# Patient Record
Sex: Male | Born: 1950 | Race: White | Hispanic: No | Marital: Married | Smoking: Never smoker
Health system: Southern US, Community
[De-identification: ages and names within clinical notes are randomized; demographics above are authoritative.]

## PROBLEM LIST (undated history)

## (undated) DIAGNOSIS — H269 Unspecified cataract: Secondary | ICD-10-CM

## (undated) DIAGNOSIS — K5730 Diverticulosis of large intestine without perforation or abscess without bleeding: Secondary | ICD-10-CM

## (undated) DIAGNOSIS — E079 Disorder of thyroid, unspecified: Secondary | ICD-10-CM

## (undated) DIAGNOSIS — T7840XA Allergy, unspecified, initial encounter: Secondary | ICD-10-CM

## (undated) DIAGNOSIS — M549 Dorsalgia, unspecified: Secondary | ICD-10-CM

## (undated) DIAGNOSIS — N189 Chronic kidney disease, unspecified: Secondary | ICD-10-CM

## (undated) DIAGNOSIS — E785 Hyperlipidemia, unspecified: Secondary | ICD-10-CM

## (undated) HISTORY — PX: COLONOSCOPY: SHX174

## (undated) HISTORY — DX: Chronic kidney disease, unspecified: N18.9

## (undated) HISTORY — DX: Dorsalgia, unspecified: M54.9

## (undated) HISTORY — DX: Hyperlipidemia, unspecified: E78.5

## (undated) HISTORY — DX: Disorder of thyroid, unspecified: E07.9

## (undated) HISTORY — DX: Unspecified cataract: H26.9

## (undated) HISTORY — DX: Diverticulosis of large intestine without perforation or abscess without bleeding: K57.30

## (undated) HISTORY — DX: Allergy, unspecified, initial encounter: T78.40XA

## (undated) HISTORY — PX: POLYPECTOMY: SHX149

---

## 1986-05-01 HISTORY — PX: CHOLECYSTECTOMY: SHX55

## 1986-05-01 HISTORY — PX: INCISIONAL HERNIA REPAIR: SHX193

## 1999-08-23 ENCOUNTER — Encounter (INDEPENDENT_AMBULATORY_CARE_PROVIDER_SITE_OTHER): Payer: Self-pay

## 1999-08-23 ENCOUNTER — Other Ambulatory Visit: Admission: RE | Admit: 1999-08-23 | Discharge: 1999-08-23 | Payer: Self-pay | Admitting: Gastroenterology

## 2003-06-22 ENCOUNTER — Ambulatory Visit (HOSPITAL_BASED_OUTPATIENT_CLINIC_OR_DEPARTMENT_OTHER): Admission: RE | Admit: 2003-06-22 | Discharge: 2003-06-22 | Payer: Self-pay | Admitting: Internal Medicine

## 2003-06-26 ENCOUNTER — Encounter: Payer: Self-pay | Admitting: Internal Medicine

## 2005-02-01 ENCOUNTER — Ambulatory Visit: Payer: Self-pay | Admitting: Internal Medicine

## 2005-05-18 ENCOUNTER — Ambulatory Visit: Payer: Self-pay | Admitting: Internal Medicine

## 2005-05-26 ENCOUNTER — Ambulatory Visit: Payer: Self-pay | Admitting: Internal Medicine

## 2006-03-07 ENCOUNTER — Ambulatory Visit: Payer: Self-pay | Admitting: Internal Medicine

## 2006-07-23 ENCOUNTER — Ambulatory Visit: Payer: Self-pay | Admitting: Internal Medicine

## 2007-07-22 ENCOUNTER — Ambulatory Visit: Payer: Self-pay | Admitting: Internal Medicine

## 2007-07-22 LAB — CONVERTED CEMR LAB
ALT: 36 units/L (ref 0–53)
Basophils Relative: 0.5 % (ref 0.0–1.0)
Bilirubin, Direct: 0.2 mg/dL (ref 0.0–0.3)
CO2: 30 meq/L (ref 19–32)
Eosinophils Relative: 4.7 % (ref 0.0–5.0)
GFR calc Af Amer: 111 mL/min
Glucose, Bld: 102 mg/dL — ABNORMAL HIGH (ref 70–99)
Glucose, Urine, Semiquant: NEGATIVE
Hemoglobin: 15.4 g/dL (ref 13.0–17.0)
Lymphocytes Relative: 21.7 % (ref 12.0–46.0)
Monocytes Absolute: 0.3 10*3/uL (ref 0.2–0.7)
Neutro Abs: 3.6 10*3/uL (ref 1.4–7.7)
Neutrophils Relative %: 66.5 % (ref 43.0–77.0)
Nitrite: NEGATIVE
Potassium: 4.3 meq/L (ref 3.5–5.1)
TSH: 2.12 microintl units/mL (ref 0.35–5.50)
Total Protein: 6.3 g/dL (ref 6.0–8.3)
VLDL: 22 mg/dL (ref 0–40)
WBC Urine, dipstick: NEGATIVE
WBC: 5.2 10*3/uL (ref 4.5–10.5)
pH: 5.5

## 2007-07-29 ENCOUNTER — Ambulatory Visit: Payer: Self-pay | Admitting: Internal Medicine

## 2007-07-29 DIAGNOSIS — E785 Hyperlipidemia, unspecified: Secondary | ICD-10-CM | POA: Insufficient documentation

## 2007-07-29 DIAGNOSIS — Z860100 Personal history of colon polyps, unspecified: Secondary | ICD-10-CM | POA: Insufficient documentation

## 2007-07-29 DIAGNOSIS — K5730 Diverticulosis of large intestine without perforation or abscess without bleeding: Secondary | ICD-10-CM

## 2007-07-29 DIAGNOSIS — E039 Hypothyroidism, unspecified: Secondary | ICD-10-CM

## 2007-07-29 HISTORY — DX: Diverticulosis of large intestine without perforation or abscess without bleeding: K57.30

## 2008-07-24 ENCOUNTER — Ambulatory Visit: Payer: Self-pay | Admitting: Internal Medicine

## 2008-07-24 LAB — CONVERTED CEMR LAB
ALT: 27 units/L (ref 0–53)
AST: 18 units/L (ref 0–37)
Albumin: 3.8 g/dL (ref 3.5–5.2)
Alkaline Phosphatase: 87 units/L (ref 39–117)
Basophils Relative: 0.3 % (ref 0.0–3.0)
Eosinophils Relative: 2.9 % (ref 0.0–5.0)
GFR calc non Af Amer: 81.03 mL/min (ref >60)
Glucose, Bld: 110 mg/dL — ABNORMAL HIGH (ref 70–99)
Glucose, Urine, Semiquant: NEGATIVE
HCT: 41.5 % (ref 39.0–52.0)
Hemoglobin: 14.8 g/dL (ref 13.0–17.0)
Lymphs Abs: 1.2 10*3/uL (ref 0.7–4.0)
Monocytes Relative: 6.9 % (ref 3.0–12.0)
Neutro Abs: 3.9 10*3/uL (ref 1.4–7.7)
Potassium: 3.8 meq/L (ref 3.5–5.1)
RBC: 4.49 M/uL (ref 4.22–5.81)
RDW: 11.8 % (ref 11.5–14.6)
Sodium: 144 meq/L (ref 135–145)
TSH: 1 microintl units/mL (ref 0.35–5.50)
Total CHOL/HDL Ratio: 4
VLDL: 24.2 mg/dL (ref 0.0–40.0)
WBC Urine, dipstick: NEGATIVE
WBC: 5.7 10*3/uL (ref 4.5–10.5)
pH: 6.5

## 2008-08-03 ENCOUNTER — Ambulatory Visit: Payer: Self-pay | Admitting: Internal Medicine

## 2008-08-03 DIAGNOSIS — R0609 Other forms of dyspnea: Secondary | ICD-10-CM

## 2008-08-03 DIAGNOSIS — R0989 Other specified symptoms and signs involving the circulatory and respiratory systems: Secondary | ICD-10-CM

## 2009-01-12 ENCOUNTER — Ambulatory Visit: Payer: Self-pay | Admitting: Internal Medicine

## 2009-01-13 LAB — CONVERTED CEMR LAB
BUN: 13 mg/dL (ref 6–23)
Chloride: 107 meq/L (ref 96–112)
Glucose, Bld: 109 mg/dL — ABNORMAL HIGH (ref 70–99)
Potassium: 4.6 meq/L (ref 3.5–5.1)
Sodium: 142 meq/L (ref 135–145)

## 2009-01-26 ENCOUNTER — Ambulatory Visit: Payer: Self-pay | Admitting: Internal Medicine

## 2009-01-26 DIAGNOSIS — M549 Dorsalgia, unspecified: Secondary | ICD-10-CM

## 2009-01-26 HISTORY — DX: Dorsalgia, unspecified: M54.9

## 2009-02-01 ENCOUNTER — Ambulatory Visit: Payer: Self-pay | Admitting: Gastroenterology

## 2009-11-10 ENCOUNTER — Telehealth: Payer: Self-pay | Admitting: Gastroenterology

## 2010-02-03 ENCOUNTER — Ambulatory Visit: Payer: Self-pay | Admitting: Internal Medicine

## 2010-04-18 ENCOUNTER — Ambulatory Visit: Payer: Self-pay | Admitting: Internal Medicine

## 2010-04-18 LAB — CONVERTED CEMR LAB
AST: 21 units/L (ref 0–37)
Albumin: 3.8 g/dL (ref 3.5–5.2)
Alkaline Phosphatase: 84 units/L (ref 39–117)
Basophils Absolute: 0 10*3/uL (ref 0.0–0.1)
Bilirubin, Direct: 0.1 mg/dL (ref 0.0–0.3)
Blood in Urine, dipstick: NEGATIVE
CO2: 31 meq/L (ref 19–32)
Calcium: 9 mg/dL (ref 8.4–10.5)
Creatinine, Ser: 0.9 mg/dL (ref 0.4–1.5)
Eosinophils Absolute: 0.2 10*3/uL (ref 0.0–0.7)
GFR calc non Af Amer: 86.52 mL/min (ref >60.00)
Glucose, Bld: 98 mg/dL (ref 70–99)
Glucose, Urine, Semiquant: NEGATIVE
HDL: 50.6 mg/dL (ref >39.00)
Hemoglobin: 14.7 g/dL (ref 13.0–17.0)
Lymphocytes Relative: 26 % (ref 12.0–46.0)
MCHC: 34.5 g/dL (ref 30.0–36.0)
Monocytes Relative: 7.7 % (ref 3.0–12.0)
Neutro Abs: 3 10*3/uL (ref 1.4–7.7)
Neutrophils Relative %: 61.7 % (ref 43.0–77.0)
Nitrite: NEGATIVE
Platelets: 256 10*3/uL (ref 150.0–400.0)
RDW: 13.1 % (ref 11.5–14.6)
Sodium: 140 meq/L (ref 135–145)
Specific Gravity, Urine: 1.025
Total Bilirubin: 0.8 mg/dL (ref 0.3–1.2)
Triglycerides: 150 mg/dL — ABNORMAL HIGH (ref 0.0–149.0)
VLDL: 30 mg/dL (ref 0.0–40.0)
WBC Urine, dipstick: NEGATIVE
pH: 6.5

## 2010-05-04 ENCOUNTER — Ambulatory Visit
Admission: RE | Admit: 2010-05-04 | Discharge: 2010-05-04 | Payer: Self-pay | Source: Home / Self Care | Attending: Internal Medicine | Admitting: Internal Medicine

## 2010-05-04 DIAGNOSIS — K4090 Unilateral inguinal hernia, without obstruction or gangrene, not specified as recurrent: Secondary | ICD-10-CM | POA: Insufficient documentation

## 2010-05-12 ENCOUNTER — Encounter: Payer: Self-pay | Admitting: Internal Medicine

## 2010-05-25 LAB — COMPREHENSIVE METABOLIC PANEL
Alkaline Phosphatase: 92 U/L (ref 39–117)
BUN: 15 mg/dL (ref 6–23)
Creatinine, Ser: 1.05 mg/dL (ref 0.4–1.5)
Glucose, Bld: 111 mg/dL — ABNORMAL HIGH (ref 70–99)
Potassium: 4 mEq/L (ref 3.5–5.1)
Total Protein: 7.1 g/dL (ref 6.0–8.3)

## 2010-05-25 LAB — SURGICAL PCR SCREEN
MRSA, PCR: NEGATIVE
Staphylococcus aureus: POSITIVE — AB

## 2010-05-31 ENCOUNTER — Ambulatory Visit (HOSPITAL_COMMUNITY)
Admission: RE | Admit: 2010-05-31 | Discharge: 2010-05-31 | Payer: Self-pay | Source: Home / Self Care | Attending: General Surgery | Admitting: General Surgery

## 2010-05-31 HISTORY — PX: INGUINAL HERNIA REPAIR: SUR1180

## 2010-05-31 NOTE — Progress Notes (Signed)
Summary: Schedule Colonoscopy   Phone Note Outgoing Call Call back at [facility] Phone [phone]   Call placed by: Harlow Mares CMA Duncan Dull),  November 10, 2009 9:44 AM Call placed to: Patient Summary of Call: Left message on patients machine to call back.  Initial call taken by: Harlow Mares CMA Duncan Dull),  November 05, 2009 9:44 AM  Follow-up for Phone Call        Left a message on the patient machine to call back and schedule a previsit and procedure with our office. A letter will be mailed to the patient.   Follow-up by: Harlow Mares CMA Duncan Dull),  November 10, 2009 9:45 AM

## 2010-05-31 NOTE — Op Note (Signed)
Carl Lopez, Carl Lopez                ACCOUNT NO.:  [id number]  MEDICAL RECORD NO.:  [id number]          PATIENT TYPE:  AMB  LOCATION:  DAY                          FACILITY:  [facility]  PHYSICIAN:  Angelia Mould. Derrell Lolling, M.D.DATE OF BIRTH:  May 19, 1950  DATE OF PROCEDURE:  05/31/2010 DATE OF DISCHARGE:                              OPERATIVE REPORT   PREOPERATIVE DIAGNOSIS:  Right inguinal hernia.  POSTOPERATIVE DIAGNOSIS:  Right inguinal hernia.  OPERATION PERFORMED:  Laparoscopic, preperitoneal repair of right inguinal hernia with Ultrapro mesh (TEPP)  SURGEON:  Angelia Mould. Derrell Lolling, MD  OPERATIVE INDICATIONS:  This is a healthy 60 year old gentleman who has had a bulge and some discomfort in his right groin for about 6 months. He thinks this is getting bigger.  It is tended to be reducible.  He has no prior history of hernia.  He has had an incisional hernia and a right subcostal incision which was repaired many years ago.  On exam, he has a moderate-sized right inguinal hernia that does not extend into the scrotum, it is reducible.  There is no evidence of hernia on the left side.  He wanted to go ahead and had this repaired at this time.  He was brought to the operating room electively.  OPERATIVE TECHNIQUE:  Following the induction of general endotracheal anesthesia, surgical time-out was held identifying correct patient, correct procedure, and correct site.  Intravenous antibiotics were given.  Foley catheter was inserted and the bladder was emptied.  The Foley balloon was deflated but the Foley was left in place.  The abdomen and genitalia were prepped and draped in a sterile fashion.  A 0.5% Marcaine with epinephrine was used as local infiltration anesthetic.  A curved transverse incision was made at the lower rim of the umbilicus. The fascia was incised transversely exposing the right rectus muscle. The right rectus muscle was retracted laterally and we bluntly created a space in  the right rectus sheath.  The space maker balloon was then inserted slowly in the right rectus sheath in the midline down to just above the symphysis pubis.  The video camera was inserted and the balloon was inflated under direct vision.  The balloon deployed fairly nicely, little bit more on the right than on the left.  We can see the rectus muscles superiorly and anteriorly, Cooper's ligaments inferiorly in the inferior epigastric vessels.  We held this in place for about 5 minutes.  The dissector balloon was then removed and deflated.  The trocar balloon was inflated and the trocar was secured.  The trocar was then connected to the insufflator at 12 mmHg.  Video cam was inserted. We had good space creation.  There was some blood around the symphysis pubis and Cooper's ligaments, but once this was evacuated, there was no active bleeding.  We inserted a 5-mm trocar in the midline just below the umbilicus and a 5-mm trocar in the left lower quadrant after we cleaned off the peritoneum.  On the right side, the inferior epigastric vessels had been dissected away from the rectus muscle and was draping down into the field.  We had  to dissect these free and clip them with multiple clips and divide this to get it out of the way.  We then inspected the indirect hernia spaces.  He really did not have much of the defect in the direct hernia space.  He had a large indirect hernia which was reduced slowly.  We had good visualization of the vas deferens and testicular vessels.  We had good visualization of the hernia sac and pulled it way back above the level of the anterior superior iliac spine. We cleaned off the peritoneum off of the lateral abdominal wall of the right side.  We felt that this completed the dissection.  A 3 inch x 6 inch piece of Ultrapro mesh was inserted and positioned transversely so as to overlap the midline slightly, and overlap Cooper's ligament inferiorly slightly.  The mesh  was fixed in place with 5 mm ProTack.  We put 3 tacks along the superior rim of the symphysis pubis and Cooper's ligament.  A couple of tacks up in the midline, a couple of tacks across the posterior belly of the rectus muscle.  Laterally, we placed about 3 tacks to secure the mesh laterally.  Laterally, we were very certain that we could palpate the tacker through the abdominal wall to be sure that all of the tacks were above the iliopubic tract.  This provided very smooth, very secure, and broad coverage, both in direct and indirect spaces.  The peritoneum was examined and the hernia sac was examined being well back and away from the mesh.  There was a small hole in the peritoneum which was well away from the mesh.  This was simple covered up with fatty tissue.  A Veress needle was placed in the left upper quadrant to evacuate the pneumoperitoneum.  The trocars were removed under direct vision.  There was no bleeding from trocar sites. All of the pneumoperitoneum was released and then the Veress needle removed.  The fascia at the umbilicus was closed with two figure-of- eight sutures of 0 Vicryl.  All of the skin incisions were closed with subcuticular sutures of 4-0 Monocryl and Dermabond.  Clean bandages were placed and the patient taken to recovery room in stable condition. Estimated blood loss was about 10-20 mL.  Complications none.  Sponge, needles, instrument counts were correct.     Angelia Mould. Derrell Lolling, M.D.     HMI/MEDQ  D:  05/31/2010  T:  05/31/2010  Job:  [id number]  cc:   Stacie Glaze, MD [address] [place] [address]  Electronically Signed by Claud Kelp M.D. on 05/31/2010 05:48:39 PM

## 2010-05-31 NOTE — Assessment & Plan Note (Signed)
Summary: FLU SHOT/CJR   Nurse Visit   Review of Systems       Flu Vaccine Consent Questions     Do you have a history of severe allergic reactions to this vaccine? no    Any prior history of allergic reactions to egg and/or gelatin? no    Do you have a sensitivity to the preservative Thimersol? no    Do you have a past history of Guillan-Barre Syndrome? no    Do you currently have an acute febrile illness? no    Have you ever had a severe reaction to latex? no    Vaccine information given and explained to patient? yes    Are you currently pregnant? no    Lot Number:[id number]   Exp Date:10/29/2010   Site Given  Left Deltoid IM    Allergies: No Known Drug Allergies  Orders Added: 1)  Admin 1st Vaccine [90471] 2)  Flu Vaccine 29yrs + [16109]

## 2010-06-02 NOTE — Consult Note (Signed)
Summary: [facility]   Imported By: Maryln Gottron 05/19/2010 09:45:45  _____________________________________________________________________  External Attachment:    Type:   Image     Comment:   External Document

## 2010-06-02 NOTE — Assessment & Plan Note (Signed)
Summary: CPX/CJR   Vital Signs:  Patient profile:   60 year old male Height:      71 inches Weight:      182 pounds BMI:     25.48 Temp:     98.3 degrees F oral Pulse rate:   72 / minute Resp:     14 per minute BP sitting:   116 / 74  (left arm)  Vitals Entered By: Willy Eddy, LPN (May 04, 2010 3:34 PM) CC: cpx Is Patient Diabetic? No   Primary Care Provider:  Stacie Glaze MD  CC:  cpx.  History of Present Illness: The pt was asked about all immunizations, health maint. services that are appropriate to their age and was given guidance on diet exercize  and weight management was straining and noted a pop in his right groin with a burning sensation in the  groin the pain in noticible when he crosses his legs he does not deteck a mass  Preventive Screening-Counseling & Management  Alcohol-Tobacco     Smoking Status: never     Passive Smoke Exposure: no  Problems Prior to Update: 1)  Hx of Back Pain  (ICD-724.5) 2)  Snoring  (ICD-786.09) 3)  Preventive Health Care  (ICD-V70.0) 4)  Family History Breast Cancer 1st Degree Relative <50  (ICD-V16.3) 5)  Family History Diabetes 1st Degree Relative  (ICD-V18.0) 6)  Diverticulosis, Colon  (ICD-562.10) 7)  Colonic Polyps, Hx of  (ICD-V12.72) 8)  Hypothyroidism  (ICD-244.9) 9)  Hyperlipidemia  (ICD-272.4)  Current Problems (verified): 1)  Hx of Back Pain  (ICD-724.5) 2)  Snoring  (ICD-786.09) 3)  Preventive Health Care  (ICD-V70.0) 4)  Family History Breast Cancer 1st Degree Relative <50  (ICD-V16.3) 5)  Family History Diabetes 1st Degree Relative  (ICD-V18.0) 6)  Diverticulosis, Colon  (ICD-562.10) 7)  Colonic Polyps, Hx of  (ICD-V12.72) 8)  Hypothyroidism  (ICD-244.9) 9)  Hyperlipidemia  (ICD-272.4)  Medications Prior to Update: 1)  Lipitor 40 Mg  Tabs (Atorvastatin Calcium) .... Once Daily 2)  Levothyroxine Sodium 150 Mcg  Tabs (Levothyroxine Sodium) .... Once Daily 3)  Multivitamins   Caps (Multiple  Vitamin) .... Once Daily 4)  Bayer Low Strength 81 Mg  Tbec (Aspirin) .... Once Daily  Current Medications (verified): 1)  Lipitor 40 Mg  Tabs (Atorvastatin Calcium) .... Once Daily 2)  Levothyroxine Sodium 150 Mcg  Tabs (Levothyroxine Sodium) .... Once Daily 3)  Multivitamins   Caps (Multiple Vitamin) .... Once Daily 4)  Bayer Low Strength 81 Mg  Tbec (Aspirin) .... Once Daily  Allergies (verified): No Known Drug Allergies  Past History:  Family History: Last updated: 07/29/2007 Family History Diabetes 1st degree relative  father mother bone mets Family History Breast cancer 1st degree relative <50 colon cancer in paternal uncle  Social History: Last updated: 07/29/2007 Married Never Smoked Alcohol use-yes Drug use-no Regular exercise-yes  Risk Factors: Exercise: yes (07/29/2007)  Risk Factors: Smoking Status: never (05/04/2010) Passive Smoke Exposure: no (05/04/2010)  Past medical, surgical, family and social histories (including risk factors) reviewed, and no changes noted (except as noted below).  Past Medical History: Reviewed history from 07/29/2007 and no changes required. Hyperlipidemia Hypothyroidism Colonic polyps, hx of Diverticulosis, colon  Past Surgical History: Reviewed history from 07/29/2007 and no changes required. Cholecystectomy Tonsillectomy ventral hernis repair colon 2005  Family History: Reviewed history from 07/29/2007 and no changes required. Family History Diabetes 1st degree relative  father mother bone mets Family History Breast cancer 1st  degree relative <50 colon cancer in paternal uncle  Social History: Reviewed history from 07/29/2007 and no changes required. Married Never Smoked Alcohol use-yes Drug use-no Regular exercise-yes  Review of Systems  The patient denies anorexia, fever, weight loss, weight gain, vision loss, decreased hearing, hoarseness, chest pain, syncope, dyspnea on exertion, peripheral edema,  prolonged cough, headaches, hemoptysis, abdominal pain, melena, hematochezia, severe indigestion/heartburn, hematuria, incontinence, genital sores, muscle weakness, suspicious skin lesions, transient blindness, difficulty walking, depression, unusual weight change, abnormal bleeding, enlarged lymph nodes, angioedema, and breast masses.    Physical Exam  General:  Well-developed,well-nourished,in no acute distress; alert,appropriate and cooperative throughout examination Head:  normocephalic, atraumatic, and male-pattern balding.   Ears:  R ear normal and L ear normal.   Mouth:  good dentition and pharynx pink and moist.   Neck:  No deformities, masses, or tenderness noted. Lungs:  Normal respiratory effort, chest expands symmetrically. Lungs are clear to auscultation, no crackles or wheezes. Heart:  normal rate, regular rhythm, no murmur, and no rub.   Abdomen:  Bowel sounds positive,abdomen soft and non-tender without masses, organomegaly or hernias noted.  surgical scar Rectal:  normal sphincter tone.   Genitalia:  circumcised.  larger right hernia Prostate:  no gland enlargement and no nodules.   Msk:  No deformity or scoliosis noted of thoracic or lumbar spine.   Pulses:  R and L carotid,radial,femoral,dorsalis pedis and posterior tibial pulses are full and equal bilaterally Extremities:  No clubbing, cyanosis, edema, or deformity noted with normal full range of motion of all joints.   Neurologic:  alert & oriented X3, gait normal, DTRs symmetrical and normal, and finger-to-nose normal.     Impression & Recommendations:  Problem # 1:  PREVENTIVE HEALTH CARE (ICD-V70.0) The pt was asked about all immunizations, health maint. services that are appropriate to their age and was given guidance on diet exercize  and weight management  Colonoscopy: Adenomatous Polyp (06/26/2003) Td Booster: Tdap (06/29/2005)   Flu Vax: Fluvax 3+ (02/03/2010)   Chol: 152 (04/18/2010)   HDL: 50.60 (04/18/2010)    LDL: 71 (04/18/2010)   TG: 150.0 (04/18/2010) TSH: 1.11 (04/18/2010)   PSA: 0.44 (04/18/2010) Next Colonoscopy due:: 06/2008 (08/03/2008)  Discussed using sunscreen, use of alcohol, drug use, self testicular exam, routine dental care, routine eye care, routine physical exam, seat belts, multiple vitamins, osteoporosis prevention, adequate calcium intake in diet, and recommendations for immunizations.  Discussed exercise and checking cholesterol.  Discussed gun safety, safe sex, and contraception. Also recommend checking PSA.  Problem # 2:  HYPOTHYROIDISM (ICD-244.9)  His updated medication list for this problem includes:    Levothyroxine Sodium 150 Mcg Tabs (Levothyroxine sodium) ..... Once daily  Labs Reviewed: TSH: 1.11 (04/18/2010)    Chol: 152 (04/18/2010)   HDL: 50.60 (04/18/2010)   LDL: 71 (04/18/2010)   TG: 150.0 (04/18/2010)  Problem # 3:  HYPERLIPIDEMIA (ICD-272.4) Assessment: Unchanged  His updated medication list for this problem includes:    Lipitor 40 Mg Tabs (Atorvastatin calcium) ..... Once daily  Labs Reviewed: SGOT: 21 (04/18/2010)   SGPT: 19 (04/18/2010)  Lipid Goals: Chol Goal: 200 (01/12/2009)   HDL Goal: 40 (01/12/2009)   LDL Goal: 160 (01/12/2009)   TG Goal: 150 (01/12/2009)  Prior 10 Yr Risk Heart Disease: 4 % (01/12/2009)   HDL:50.60 (04/18/2010), 45.90 (07/24/2008)  LDL:71 (04/18/2010), 94 (07/24/2008)  Chol:152 (04/18/2010), 164 (07/24/2008)  Trig:150.0 (04/18/2010), 121.0 (07/24/2008)  Complete Medication List: 1)  Lipitor 40 Mg Tabs (Atorvastatin calcium) .... Once daily 2)  Levothyroxine Sodium 150 Mcg Tabs (Levothyroxine sodium) .... Once daily 3)  Multivitamins Caps (Multiple vitamin) .... Once daily 4)  Bayer Low Strength 81 Mg Tbec (Aspirin) .... Once daily  Other Orders: Surgical Referral (Surgery)  Patient Instructions: 1)  Please schedule a follow-up appointment in 6 months. 2)  Hepatic Panel prior to visit, ICD-9:995.20 3)  Lipid Panel  prior to visit, ICD-9:272.4 Prescriptions: LEVOTHYROXINE SODIUM 150 MCG  TABS (LEVOTHYROXINE SODIUM) once daily  #30 Tablet x 11   Entered by:   Willy Eddy, LPN   Authorized by:   Stacie Glaze MD   Signed by:   Willy Eddy, LPN on 16/01/9603   Method used:   Electronically to        [facility]  [phone]* (retail)       [address] [place], [place]  [address]       Ph: [phone] or [phone]       Fax: [phone]   RxID:   [id number] LIPITOR 40 MG  TABS (ATORVASTATIN CALCIUM) once daily  #30 Tablet x 11   Entered by:   Willy Eddy, LPN   Authorized by:   Stacie Glaze MD   Signed by:   Willy Eddy, LPN on 74/25/9563   Method used:   Electronically to        [facility]  [phone]* (retail)       [address] [place], [place]  [address]       Ph: [phone] or [phone]       Fax: [phone]   RxID:   [id number]    Orders Added: 1)  Est. Patient 40-64 years [99396] 2)  Est. Patient Level III [61607] 3)  Surgical Referral [Surgery]

## 2010-09-19 ENCOUNTER — Ambulatory Visit (AMBULATORY_SURGERY_CENTER): Payer: 59 | Admitting: *Deleted

## 2010-09-19 VITALS — Ht 71.0 in | Wt 185.5 lb

## 2010-09-19 DIAGNOSIS — Z86010 Personal history of colonic polyps: Secondary | ICD-10-CM

## 2010-09-19 MED ORDER — PEG-KCL-NACL-NASULF-NA ASC-C 100 G PO SOLR: ORAL | Status: DC

## 2010-09-20 ENCOUNTER — Encounter: Payer: Self-pay | Admitting: Gastroenterology

## 2010-10-03 ENCOUNTER — Other Ambulatory Visit: Payer: Self-pay | Admitting: Gastroenterology

## 2010-10-05 ENCOUNTER — Ambulatory Visit (AMBULATORY_SURGERY_CENTER): Payer: 59 | Admitting: Gastroenterology

## 2010-10-05 ENCOUNTER — Encounter: Payer: Self-pay | Admitting: Gastroenterology

## 2010-10-05 DIAGNOSIS — K5730 Diverticulosis of large intestine without perforation or abscess without bleeding: Secondary | ICD-10-CM

## 2010-10-05 DIAGNOSIS — Z1211 Encounter for screening for malignant neoplasm of colon: Secondary | ICD-10-CM

## 2010-10-05 DIAGNOSIS — D126 Benign neoplasm of colon, unspecified: Secondary | ICD-10-CM

## 2010-10-05 DIAGNOSIS — Z86010 Personal history of colonic polyps: Secondary | ICD-10-CM

## 2010-10-05 MED ORDER — SODIUM CHLORIDE 0.9 % IV SOLN: 500.0000 mL | INTRAVENOUS | Status: DC

## 2010-10-05 NOTE — Patient Instructions (Signed)
Resume your medications and follow the discharge instructions.  High Fiber diet with liberal fluid intake.  Await pathology results.

## 2010-10-06 ENCOUNTER — Telehealth: Payer: Self-pay | Admitting: *Deleted

## 2010-10-06 NOTE — Telephone Encounter (Signed)

## 2010-10-10 ENCOUNTER — Encounter: Payer: Self-pay | Admitting: Gastroenterology

## 2011-02-27 ENCOUNTER — Ambulatory Visit (INDEPENDENT_AMBULATORY_CARE_PROVIDER_SITE_OTHER): Payer: 59

## 2011-02-27 DIAGNOSIS — Z23 Encounter for immunization: Secondary | ICD-10-CM

## 2011-07-17 ENCOUNTER — Other Ambulatory Visit: Payer: Self-pay | Admitting: Internal Medicine

## 2011-09-08 ENCOUNTER — Other Ambulatory Visit: Payer: Self-pay | Admitting: *Deleted

## 2011-09-08 MED ORDER — ATORVASTATIN CALCIUM 40 MG PO TABS: 40.0000 mg | ORAL_TABLET | Freq: Every day | ORAL | Status: DC

## 2011-11-20 ENCOUNTER — Other Ambulatory Visit (INDEPENDENT_AMBULATORY_CARE_PROVIDER_SITE_OTHER): Payer: 59

## 2011-11-20 DIAGNOSIS — Z0000 Encounter for general adult medical examination without abnormal findings: Secondary | ICD-10-CM

## 2011-11-20 LAB — POCT URINALYSIS DIPSTICK
Bilirubin, UA: NEGATIVE
Glucose, UA: NEGATIVE
Ketones, UA: NEGATIVE
Protein, UA: NEGATIVE
Spec Grav, UA: 1.02

## 2011-11-20 LAB — BASIC METABOLIC PANEL
BUN: 14 mg/dL (ref 6–23)
CO2: 29 mEq/L (ref 19–32)
Chloride: 104 mEq/L (ref 96–112)
GFR: 91.68 mL/min (ref >60.00)
Glucose, Bld: 98 mg/dL (ref 70–99)
Potassium: 4.1 mEq/L (ref 3.5–5.1)
Sodium: 140 mEq/L (ref 135–145)

## 2011-11-20 LAB — CBC WITH DIFFERENTIAL/PLATELET
Basophils Relative: 0.5 % (ref 0.0–3.0)
Eosinophils Absolute: 0.2 10*3/uL (ref 0.0–0.7)
Lymphocytes Relative: 20.1 % (ref 12.0–46.0)
MCHC: 33.5 g/dL (ref 30.0–36.0)
Neutrophils Relative %: 70.1 % (ref 43.0–77.0)
RBC: 4.73 Mil/uL (ref 4.22–5.81)

## 2011-11-20 LAB — HEPATIC FUNCTION PANEL
Alkaline Phosphatase: 100 U/L (ref 39–117)
Bilirubin, Direct: 0.1 mg/dL (ref 0.0–0.3)
Total Bilirubin: 1.2 mg/dL (ref 0.3–1.2)
Total Protein: 6.6 g/dL (ref 6.0–8.3)

## 2011-11-20 LAB — LIPID PANEL
Cholesterol: 183 mg/dL (ref 0–200)
HDL: 49.9 mg/dL (ref >39.00)
Triglycerides: 190 mg/dL — ABNORMAL HIGH (ref 0.0–149.0)
VLDL: 38 mg/dL (ref 0.0–40.0)

## 2011-11-20 LAB — PSA: PSA: 0.56 ng/mL (ref 0.10–4.00)

## 2011-11-27 ENCOUNTER — Ambulatory Visit (INDEPENDENT_AMBULATORY_CARE_PROVIDER_SITE_OTHER): Payer: 59 | Admitting: Internal Medicine

## 2011-11-27 ENCOUNTER — Encounter: Payer: Self-pay | Admitting: Internal Medicine

## 2011-11-27 VITALS — BP 136/80 | HR 72 | Temp 98.0°F | Resp 16 | Ht 71.0 in | Wt 186.0 lb

## 2011-11-27 DIAGNOSIS — Z2911 Encounter for prophylactic immunotherapy for respiratory syncytial virus (RSV): Secondary | ICD-10-CM

## 2011-11-27 DIAGNOSIS — E785 Hyperlipidemia, unspecified: Secondary | ICD-10-CM

## 2011-11-27 DIAGNOSIS — E039 Hypothyroidism, unspecified: Secondary | ICD-10-CM

## 2011-11-27 DIAGNOSIS — Z0000 Encounter for general adult medical examination without abnormal findings: Secondary | ICD-10-CM

## 2011-11-27 NOTE — Patient Instructions (Addendum)
The patient is instructed to continue all medications as prescribed. Schedule followup with check out clerk upon leaving the clinic  

## 2011-11-27 NOTE — Addendum Note (Signed)
Addended by: Willy Eddy on: 11/27/2011 03:28 PM   Modules accepted: Orders

## 2011-11-27 NOTE — Progress Notes (Signed)
Subjective:    Patient ID: Carl Lopez, male    DOB: 1951/03/04, 61 y.o.   MRN: [id number]  HPI  CPX  Noted decreased energy TSH increased Changed to generic  Review of Systems  Constitutional: Negative for fever and fatigue.  HENT: Negative for hearing loss, congestion, neck pain and postnasal drip.   Eyes: Negative for discharge, redness and visual disturbance.  Respiratory: Negative for cough, shortness of breath and wheezing.   Cardiovascular: Negative for leg swelling.  Gastrointestinal: Negative for abdominal pain, constipation and abdominal distention.  Genitourinary: Negative for urgency and frequency.  Musculoskeletal: Negative for joint swelling and arthralgias.  Skin: Negative for color change and rash.  Neurological: Negative for weakness and light-headedness.  Hematological: Negative for adenopathy.  Psychiatric/Behavioral: Negative for behavioral problems.   Past Medical History  Diagnosis Date  . Thyroid disease   . Hyperlipidemia     History   Social History  . Marital Status: Married    Spouse Name: N/A    Number of Children: N/A  . Years of Education: N/A   Occupational History  . Not on file.   Social History Main Topics  . Smoking status: Never Smoker   . Smokeless tobacco: Not on file  . Alcohol Use: 1.2 oz/week    2 Glasses of wine per week  . Drug Use: No  . Sexually Active:    Other Topics Concern  . Not on file   Social History Narrative  . No narrative on file    Past Surgical History  Procedure Date  . Inguinal hernia repair 05-31-10  . Cholecystectomy 1988  . Incisional hernia repair 1988  . Polypectomy   . Colonoscopy     Family History  Problem Relation Age of Onset  . Colon cancer Maternal Uncle   . Colon cancer Other     No Known Allergies  Current Outpatient Prescriptions on File Prior to Visit  Medication Sig Dispense Refill  . aspirin 81 MG tablet Take 81 mg by mouth daily.        Marland Kitchen atorvastatin  (LIPITOR) 40 MG tablet Take 1 tablet (40 mg total) by mouth daily.  90 tablet  3  . levothyroxine (SYNTHROID, LEVOTHROID) 150 MCG tablet Take 150 mcg by mouth daily.        . Multiple Vitamin (MULTIVITAMIN) capsule Take 1 capsule by mouth daily.         Current Facility-Administered Medications on File Prior to Visit  Medication Dose Route Frequency Provider Last Rate Last Dose  . DISCONTD: 0.9 %  sodium chloride infusion  500 mL Intravenous Continuous Meryl Dare, MD,FACG        BP 136/80  Pulse 72  Temp 98 F (36.7 C)  Resp 16  Ht 5\' 11"  (1.803 m)  Wt 186 lb (84.369 kg)  BMI 25.94 kg/m2       Objective:   Physical Exam  Nursing note and vitals reviewed. Constitutional: He is oriented to person, place, and time. He appears well-developed and well-nourished.  HENT:  Head: Normocephalic and atraumatic.  Eyes: Conjunctivae are normal. Pupils are equal, round, and reactive to light.  Neck: Normal range of motion. Neck supple.  Cardiovascular: Normal rate and regular rhythm.   Pulmonary/Chest: Effort normal and breath sounds normal.  Abdominal: Soft. Bowel sounds are normal.  Genitourinary: Rectum normal and prostate normal.  Musculoskeletal: Normal range of motion.  Neurological: He is alert and oriented to person, place, and time.  Skin: Skin is  warm and dry.          Assessment & Plan:   Patient presents for yearly preventative medicine examination.   all immunizations and health maintenance protocols were reviewed with the patient and they are up to date with these protocols.   screening laboratory values were reviewed with the patient including screening of hyperlipidemia PSA renal function and hepatic function.   There medications past medical history social history problem list and allergies were reviewed in detail.   Goals were established with regard to weight loss exercise diet in compliance with medications   Change to 175 MCG  And moniter, if the  150  branded does not work  Shingles vaccine today

## 2012-01-17 ENCOUNTER — Other Ambulatory Visit: Payer: Self-pay | Admitting: Internal Medicine

## 2012-01-17 MED ORDER — LEVOTHYROXINE SODIUM 150 MCG PO TABS: 150.0000 ug | ORAL_TABLET | Freq: Every day | ORAL | Status: DC

## 2012-01-17 NOTE — Telephone Encounter (Signed)
Pt needs more samples of synthroid 150 mcg. Pt has appt on 02-09-2012

## 2012-01-22 ENCOUNTER — Telehealth: Payer: Self-pay | Admitting: Internal Medicine

## 2012-01-22 MED ORDER — LEVOTHYROXINE SODIUM 150 MCG PO TABS: 150.0000 ug | ORAL_TABLET | Freq: Every day | ORAL | Status: DC

## 2012-01-22 NOTE — Telephone Encounter (Signed)
Patient called stating that his [facility]. Patient is completely out and would like to have some called into [facility] until he can receive his rx. Please assist.

## 2012-01-22 NOTE — Telephone Encounter (Signed)
rx sent in electronically 

## 2012-02-02 ENCOUNTER — Other Ambulatory Visit (INDEPENDENT_AMBULATORY_CARE_PROVIDER_SITE_OTHER): Payer: 59

## 2012-02-02 DIAGNOSIS — E039 Hypothyroidism, unspecified: Secondary | ICD-10-CM

## 2012-02-02 LAB — T3, FREE: T3, Free: 2.5 pg/mL (ref 2.3–4.2)

## 2012-02-09 ENCOUNTER — Ambulatory Visit (INDEPENDENT_AMBULATORY_CARE_PROVIDER_SITE_OTHER): Payer: 59 | Admitting: Internal Medicine

## 2012-02-09 ENCOUNTER — Encounter: Payer: Self-pay | Admitting: Internal Medicine

## 2012-02-09 VITALS — BP 140/90 | HR 72 | Temp 98.3°F | Resp 16 | Ht 71.0 in | Wt 186.0 lb

## 2012-02-09 DIAGNOSIS — E039 Hypothyroidism, unspecified: Secondary | ICD-10-CM

## 2012-02-09 DIAGNOSIS — Z23 Encounter for immunization: Secondary | ICD-10-CM

## 2012-02-09 NOTE — Progress Notes (Signed)
Subjective:    Patient ID: Carl Lopez, male    DOB: 27-Aug-1950, 61 y.o.   MRN: [id number]  HPI Patient is to followup for hypothyroidism on 150 mcg of Synthroid lab work was done prior to his office visit which revealed a T4 in range up 0.69 with the range being 0.6-1.6 and a T3 2.5 with the range being 2.3-4.2 being in the low normal range his TSH is still not fully suppressed at 6.67 He noted a "big change from the generic to the synthroid" at the 150  Review of Systems  Constitutional: Negative for fever and fatigue.  HENT: Negative for hearing loss, congestion, neck pain and postnasal drip.   Eyes: Negative for discharge, redness and visual disturbance.  Respiratory: Negative for cough, shortness of breath and wheezing.   Cardiovascular: Negative for leg swelling.  Gastrointestinal: Negative for abdominal pain, constipation and abdominal distention.  Genitourinary: Negative for urgency and frequency.  Musculoskeletal: Negative for joint swelling and arthralgias.  Skin: Negative for color change and rash.  Neurological: Negative for weakness and light-headedness.  Hematological: Negative for adenopathy.  Psychiatric/Behavioral: Negative for behavioral problems.   Past Medical History  Diagnosis Date  . Thyroid disease   . Hyperlipidemia     History   Social History  . Marital Status: Married    Spouse Name: N/A    Number of Children: N/A  . Years of Education: N/A   Occupational History  . Not on file.   Social History Main Topics  . Smoking status: Never Smoker   . Smokeless tobacco: Not on file  . Alcohol Use: 1.2 oz/week    2 Glasses of wine per week  . Drug Use: No  . Sexually Active:    Other Topics Concern  . Not on file   Social History Narrative  . No narrative on file    Past Surgical History  Procedure Date  . Inguinal hernia repair 05-31-10  . Cholecystectomy 1988  . Incisional hernia repair 1988  . Polypectomy   . Colonoscopy     Family  History  Problem Relation Age of Onset  . Colon cancer Maternal Uncle   . Colon cancer Other     No Known Allergies  Current Outpatient Prescriptions on File Prior to Visit  Medication Sig Dispense Refill  . aspirin 81 MG tablet Take 81 mg by mouth daily.        Marland Kitchen atorvastatin (LIPITOR) 40 MG tablet Take 1 tablet (40 mg total) by mouth daily.  90 tablet  3  . levothyroxine (SYNTHROID, LEVOTHROID) 150 MCG tablet Take 1 tablet (150 mcg total) by mouth daily.  90 tablet  3  . Multiple Vitamin (MULTIVITAMIN) capsule Take 1 capsule by mouth daily.          BP 140/90  Pulse 72  Temp 98.3 F (36.8 C)  Resp 16  Ht 5\' 11"  (1.803 m)  Wt 186 lb (84.369 kg)  BMI 25.94 kg/m2       Objective:   Physical Exam  Nursing note and vitals reviewed. Constitutional: He appears well-developed and well-nourished.  HENT:  Head: Normocephalic and atraumatic.  Eyes: Conjunctivae normal are normal. Pupils are equal, round, and reactive to light.  Neck: Normal range of motion. Neck supple.  Cardiovascular: Normal rate and regular rhythm.   Pulmonary/Chest: Effort normal and breath sounds normal.  Abdominal: Soft. Bowel sounds are normal.          Assessment & Plan:  The patient is  dear replacement for thyroid however he still shows from his TSH and from the fact that his T4 is at the very minimal level that he could benefit from an increased dose of Synthroid from 150-175  I have asked the patient to monitor his blood pressure during this period of time as well as a pulse maybe once or twice a week and bring back readings in 8 weeks

## 2012-02-09 NOTE — Patient Instructions (Signed)
The patient is instructed to continue all medications as prescribed. Schedule followup with check out clerk upon leaving the clinic  

## 2012-04-10 ENCOUNTER — Other Ambulatory Visit (INDEPENDENT_AMBULATORY_CARE_PROVIDER_SITE_OTHER): Payer: 59

## 2012-04-10 DIAGNOSIS — E039 Hypothyroidism, unspecified: Secondary | ICD-10-CM

## 2012-04-10 LAB — T3, FREE: T3, Free: 2.7 pg/mL (ref 2.3–4.2)

## 2012-04-11 LAB — THYROGLOBULIN LEVEL: Thyroglobulin: <0.2 ng/mL (ref 0.0–55.0)

## 2012-04-30 ENCOUNTER — Ambulatory Visit (INDEPENDENT_AMBULATORY_CARE_PROVIDER_SITE_OTHER): Payer: 59 | Admitting: Internal Medicine

## 2012-04-30 ENCOUNTER — Encounter: Payer: Self-pay | Admitting: Internal Medicine

## 2012-04-30 VITALS — BP 120/78 | HR 72 | Temp 98.0°F | Resp 16 | Ht 71.0 in | Wt 188.0 lb

## 2012-04-30 DIAGNOSIS — E039 Hypothyroidism, unspecified: Secondary | ICD-10-CM

## 2012-04-30 MED ORDER — LEVOTHYROXINE SODIUM 200 MCG PO TABS: 200.0000 ug | ORAL_TABLET | Freq: Every day | ORAL | Status: DC

## 2012-04-30 NOTE — Progress Notes (Signed)
  Subjective:    Patient ID: Carl Lopez, male    DOB: 1951/03/10, 60 y.o.   MRN: [id number]  HPI  stablized replacement of of thyroid with synthoid vs the generic  Review of Systems  Constitutional: Negative for fever and fatigue.  HENT: Negative for hearing loss, congestion, neck pain and postnasal drip.   Eyes: Negative for discharge, redness and visual disturbance.  Respiratory: Negative for cough, shortness of breath and wheezing.   Cardiovascular: Negative for leg swelling.  Gastrointestinal: Negative for abdominal pain, constipation and abdominal distention.  Genitourinary: Negative for urgency and frequency.  Musculoskeletal: Negative for joint swelling and arthralgias.  Skin: Negative for color change and rash.  Neurological: Negative for weakness and light-headedness.  Hematological: Negative for adenopathy.  Psychiatric/Behavioral: Negative for behavioral problems.       Objective:   Physical Exam  Constitutional: He appears well-developed and well-nourished.  HENT:  Head: Normocephalic and atraumatic.  Eyes: Conjunctivae normal are normal. Pupils are equal, round, and reactive to light.  Neck: Normal range of motion. Neck supple.  Cardiovascular: Normal rate and regular rhythm.   Pulmonary/Chest: Effort normal and breath sounds normal.  Abdominal: Soft. Bowel sounds are normal.          Assessment & Plan:  Stable replacement Titrate to 200

## 2012-04-30 NOTE — Patient Instructions (Addendum)
The patient is instructed to continue all medications as prescribed. Schedule followup with check out clerk upon leaving the clinic  

## 2012-10-08 ENCOUNTER — Other Ambulatory Visit: Payer: Self-pay | Admitting: *Deleted

## 2012-10-08 MED ORDER — ATORVASTATIN CALCIUM 40 MG PO TABS: 40.0000 mg | ORAL_TABLET | Freq: Every day | ORAL | Status: DC

## 2013-01-31 ENCOUNTER — Ambulatory Visit (INDEPENDENT_AMBULATORY_CARE_PROVIDER_SITE_OTHER): Payer: Commercial Managed Care - PPO

## 2013-01-31 DIAGNOSIS — Z23 Encounter for immunization: Secondary | ICD-10-CM

## 2013-05-26 ENCOUNTER — Telehealth: Payer: Self-pay | Admitting: Internal Medicine

## 2013-05-26 NOTE — Telephone Encounter (Signed)
Pt has fup appt this Friday. Pt is requesting labs work in advance. What labs can I sch ?

## 2013-05-26 NOTE — Telephone Encounter (Signed)
Pt has been sch

## 2013-05-26 NOTE — Telephone Encounter (Signed)
May have tsh (244.9) Lipids and liver 272.4--thanks--these are fasting

## 2013-05-28 ENCOUNTER — Other Ambulatory Visit (INDEPENDENT_AMBULATORY_CARE_PROVIDER_SITE_OTHER): Payer: Commercial Managed Care - PPO

## 2013-05-28 DIAGNOSIS — E785 Hyperlipidemia, unspecified: Secondary | ICD-10-CM

## 2013-05-28 DIAGNOSIS — E039 Hypothyroidism, unspecified: Secondary | ICD-10-CM

## 2013-05-28 LAB — HEPATIC FUNCTION PANEL
ALT: 19 U/L (ref 0–53)
AST: 23 U/L (ref 0–37)
Albumin: 4.1 g/dL (ref 3.5–5.2)
Alkaline Phosphatase: 78 U/L (ref 39–117)
BILIRUBIN DIRECT: 0.1 mg/dL (ref 0.0–0.3)
BILIRUBIN TOTAL: 1.2 mg/dL (ref 0.3–1.2)
Total Protein: 6.9 g/dL (ref 6.0–8.3)

## 2013-05-28 LAB — LIPID PANEL
CHOL/HDL RATIO: 3
Cholesterol: 165 mg/dL (ref 0–200)
HDL: 57.4 mg/dL (ref >39.00)
LDL CALC: 91 mg/dL (ref 0–99)
TRIGLYCERIDES: 82 mg/dL (ref 0.0–149.0)
VLDL: 16.4 mg/dL (ref 0.0–40.0)

## 2013-05-28 LAB — TSH: TSH: 1.2 u[IU]/mL (ref 0.35–5.50)

## 2013-05-30 ENCOUNTER — Ambulatory Visit (INDEPENDENT_AMBULATORY_CARE_PROVIDER_SITE_OTHER): Payer: Commercial Managed Care - PPO | Admitting: Internal Medicine

## 2013-05-30 ENCOUNTER — Encounter: Payer: Self-pay | Admitting: Internal Medicine

## 2013-05-30 VITALS — BP 130/88 | HR 64 | Temp 98.3°F | Ht 71.0 in | Wt 186.0 lb

## 2013-05-30 DIAGNOSIS — E785 Hyperlipidemia, unspecified: Secondary | ICD-10-CM

## 2013-05-30 DIAGNOSIS — E039 Hypothyroidism, unspecified: Secondary | ICD-10-CM

## 2013-05-30 MED ORDER — LEVOTHYROXINE SODIUM 200 MCG PO TABS: 200.0000 ug | ORAL_TABLET | Freq: Every day | ORAL | Status: DC

## 2013-05-30 MED ORDER — ATORVASTATIN CALCIUM 40 MG PO TABS: 40.0000 mg | ORAL_TABLET | Freq: Every day | ORAL | Status: DC

## 2013-05-30 NOTE — Progress Notes (Signed)
Pre visit review using our clinic review tool, if applicable. No additional management support is needed unless otherwise documented below in the visit note. 

## 2013-05-30 NOTE — Progress Notes (Signed)
   Subjective:    Patient ID: Carl Lopez, male    DOB: 1951/01/19, 63 y.o.   MRN: [id number]  HPI The patient is followed    Review of Systems  Constitutional: Negative for fever and fatigue.  HENT: Negative for congestion, hearing loss and postnasal drip.   Eyes: Negative for discharge, redness and visual disturbance.  Respiratory: Negative for cough, shortness of breath and wheezing.   Cardiovascular: Negative for leg swelling.  Gastrointestinal: Negative for abdominal pain, constipation and abdominal distention.  Genitourinary: Negative for urgency and frequency.  Musculoskeletal: Negative for arthralgias, joint swelling and neck pain.  Skin: Negative for color change and rash.  Neurological: Negative for weakness and light-headedness.  Hematological: Negative for adenopathy.  Psychiatric/Behavioral: Negative for behavioral problems.   Past Medical History  Diagnosis Date  . Thyroid disease   . Hyperlipidemia     History   Social History  . Marital Status: Married    Spouse Name: N/A    Number of Children: N/A  . Years of Education: N/A   Occupational History  . Not on file.   Social History Main Topics  . Smoking status: Never Smoker   . Smokeless tobacco: Not on file  . Alcohol Use: 1.2 oz/week    2 Glasses of wine per week  . Drug Use: No  . Sexual Activity:    Other Topics Concern  . Not on file   Social History Narrative  . No narrative on file    Past Surgical History  Procedure Laterality Date  . Inguinal hernia repair  05-31-10  . Cholecystectomy  1988  . Incisional hernia repair  1988  . Polypectomy    . Colonoscopy      Family History  Problem Relation Age of Onset  . Colon cancer Maternal Uncle   . Colon cancer Other     No Known Allergies  Current Outpatient Prescriptions on File Prior to Visit  Medication Sig Dispense Refill  . aspirin 81 MG tablet Take 81 mg by mouth daily.        Marland Kitchen atorvastatin (LIPITOR) 40 MG tablet Take 1  tablet (40 mg total) by mouth daily.  90 tablet  3  . levothyroxine (SYNTHROID, LEVOTHROID) 200 MCG tablet Take 1 tablet (200 mcg total) by mouth daily.  90 tablet  3  . Multiple Vitamin (MULTIVITAMIN) capsule Take 1 capsule by mouth daily.         No current facility-administered medications on file prior to visit.    BP 130/88  Pulse 64  Temp(Src) 98.3 F (36.8 C) (Oral)  Ht 5\' 11"  (1.803 m)  Wt 186 lb (84.369 kg)  BMI 25.95 kg/m2       Objective:   Physical Exam  Constitutional: He appears well-developed and well-nourished.  HENT:  Head: Normocephalic and atraumatic.  Eyes: Conjunctivae are normal. Pupils are equal, round, and reactive to light.  Neck: Normal range of motion. Neck supple.  Cardiovascular: Normal rate and regular rhythm.   Pulmonary/Chest: Effort normal and breath sounds normal.  Abdominal: Soft. Bowel sounds are normal.          Assessment & Plan:  Stable monitoting of lipids and TSH

## 2013-07-20 ENCOUNTER — Other Ambulatory Visit: Payer: Self-pay | Admitting: Internal Medicine

## 2013-11-28 ENCOUNTER — Encounter: Payer: Commercial Managed Care - PPO | Admitting: Internal Medicine

## 2013-12-29 ENCOUNTER — Ambulatory Visit (INDEPENDENT_AMBULATORY_CARE_PROVIDER_SITE_OTHER): Payer: Medicare Other | Admitting: Family Medicine

## 2013-12-29 ENCOUNTER — Encounter: Payer: Self-pay | Admitting: Family Medicine

## 2013-12-29 VITALS — BP 131/88 | HR 70 | Temp 98.1°F | Ht 71.0 in | Wt 189.0 lb

## 2013-12-29 DIAGNOSIS — Z23 Encounter for immunization: Secondary | ICD-10-CM

## 2013-12-29 DIAGNOSIS — IMO0001 Reserved for inherently not codable concepts without codable children: Secondary | ICD-10-CM

## 2013-12-29 DIAGNOSIS — E785 Hyperlipidemia, unspecified: Secondary | ICD-10-CM

## 2013-12-29 DIAGNOSIS — R1900 Intra-abdominal and pelvic swelling, mass and lump, unspecified site: Secondary | ICD-10-CM

## 2013-12-29 DIAGNOSIS — E039 Hypothyroidism, unspecified: Secondary | ICD-10-CM

## 2013-12-29 DIAGNOSIS — Z0000 Encounter for general adult medical examination without abnormal findings: Secondary | ICD-10-CM

## 2013-12-29 LAB — COMPREHENSIVE METABOLIC PANEL
ALK PHOS: 90 U/L (ref 39–117)
ALT: 20 U/L (ref 0–53)
AST: 19 U/L (ref 0–37)
Albumin: 3.8 g/dL (ref 3.5–5.2)
BILIRUBIN TOTAL: 0.7 mg/dL (ref 0.2–1.2)
BUN: 14 mg/dL (ref 6–23)
CO2: 29 mEq/L (ref 19–32)
Calcium: 9.3 mg/dL (ref 8.4–10.5)
Chloride: 104 mEq/L (ref 96–112)
Creatinine, Ser: 1 mg/dL (ref 0.4–1.5)
GFR: 82.46 mL/min (ref >60.00)
Glucose, Bld: 105 mg/dL — ABNORMAL HIGH (ref 70–99)
POTASSIUM: 4.1 meq/L (ref 3.5–5.1)
Sodium: 141 mEq/L (ref 135–145)
Total Protein: 6.7 g/dL (ref 6.0–8.3)

## 2013-12-29 LAB — PSA: PSA: 0.62 ng/mL (ref 0.10–4.00)

## 2013-12-29 LAB — TSH: TSH: 1.97 u[IU]/mL (ref 0.35–4.50)

## 2013-12-29 NOTE — Progress Notes (Signed)
Carl Reddish, MD Phone: [phone]  Subjective:  Patient presents today to establish care with me as PCP. Chief complaint-noted.   Hyperlipidemia On statin: atorvastatin, also asa 81 Regular exercise: no, encouraged, patient currently not interested Diet: wife handles a lot of food choices and so he eats well ROS- no chest pain or shortness of breath. No myalgias  Hypothyroidism History of graves disease, now hypothyroid On thyroid medication-synthroid 257mcg ROS-No hair or nail changes. No heat/cold intolerance. Has long term issues with constipation (4-5 days)tipation or diarrhea. Denies shakiness or anxiety.  No palpitations. Shellfish and alcohol-vomiting and headache afterwards about a month ago. Marland Kitchen Has happened before with fever and muscle aches added on as well as severe nausea.   Knot in abdomen Noted for last year. Not growing in size. In epigastric area. Not painful.No treatments tried.  ROS-no nausea/vomiting(other than with shellfish and alcohol combination/lightheadedness/chest pain/shortness of breath.   Abdominal ultrasound  TSH, CMET, PSA Prevnar Influenza Abd u/s  The following were reviewed and entered/updated in epic: Past Medical History  Diagnosis Date  . Thyroid disease   . Hyperlipidemia   . DIVERTICULOSIS, COLON 07/29/2007    Qualifier: Diagnosis of  By: Arnoldo Morale MD, Balinda Quails    Patient Active Problem List   Diagnosis Date Noted  . HYPOTHYROIDISM 07/29/2007    Priority: Medium  . HYPERLIPIDEMIA 07/29/2007    Priority: Medium  . INGUINAL HERNIA, RIGHT 05/04/2010    Priority: Low  . BACK PAIN 01/26/2009    Priority: Low  . SNORING 08/03/2008    Priority: Low  . COLONIC POLYPS, HX OF 07/29/2007    Priority: Low   Past Surgical History  Procedure Laterality Date  . Inguinal hernia repair  05-31-10  . Cholecystectomy  1988  . Incisional hernia repair  1988  . Polypectomy    . Colonoscopy      Family History  Problem Relation Age of Onset   . Colon cancer Maternal Uncle   . Colon cancer Other     Medications- reviewed and updated Current Outpatient Prescriptions  Medication Sig Dispense Refill  . aspirin 81 MG tablet Take 81 mg by mouth daily.        Marland Kitchen atorvastatin (LIPITOR) 40 MG tablet Take 1 tablet (40 mg total) by mouth daily.  90 tablet  3  . Multiple Vitamin (MULTIVITAMIN) capsule Take 1 capsule by mouth daily.        Marland Kitchen SYNTHROID 200 MCG tablet Take 1 tablet by mouth  daily  90 tablet  1   No current facility-administered medications for this visit.    Allergies-reviewed and updated No Known Allergies  History   Social History  . Marital Status: Married    Spouse Name: N/A    Number of Children: N/A  . Years of Education: N/A   Social History Main Topics  . Smoking status: Never Smoker   . Smokeless tobacco: Not on file  . Alcohol Use: 1.2 oz/week    2 Glasses of wine per week  . Drug Use: No  . Sexual Activity:    Other Topics Concern  . Not on file   Social History Narrative  . No narrative on file    ROS--See HPI   Objective: BP 131/88  Pulse 70  Temp(Src) 98.1 F (36.7 C)  Ht 5\' 11"  (1.803 m)  Wt 189 lb (85.73 kg)  BMI 26.37 kg/m2 Gen: NAD, resting comfortably on tabble HEENT: Mucous membranes are moist. Oropharynx normal Neck: no thyromegaly CV: RRR  no murmurs rubs or gallops Lungs: CTAB no crackles, wheeze, rhonchi Abdomen:  Small 2-3 x 2-3 cm fixed mass just above surgical scar for cholecystectomy. Pulsatile but weak. soft/nontender/nondistended/normal bowel sounds. No rebound or guarding.  Ext: no edema Skin: warm, dry, 1.5 x 2 cm seborrheic keratosis on back of right leg.  Neuro: grossly normal, moves all extremities, PERRLA  Assessment/Plan:  63 year old presents for annual physical Health Maintenance counseling: 1. Anticipatory guidance: Patient counseled regarding regular dental exams, wearing seatbelts.  2. Risk factor reduction:  Advised patient of need for regular  exercise and diet rich and fruits and vegetables to reduce risk of heart attack and stroke.  3. Immunizations/screenings/ancillary studies: pneumovax and flu today, prevnar 1 year 4. Discussed grade D recommendations for PSA testing, patient would like to continue testing at this time. PSA ordered 5. Discussed I have not been trained for welcome to medicare exam. Will have patient back to do this within the year once trained.   HYPERLIPIDEMIA Well controlled with ldl 91 7 months ago. Continue atorvastatin. Check lipids next year.   HYPOTHYROIDISM TSH today. Symptomatically euthyroid. Likely to refill synthroid (name brand) 284mcg.   Pulsatile mass in epigastric area Weak pulse to area. ? Blood vessel near scar tissue from previous incisional hernia repair vs. Aneurysm. Abdominal ultrasound to evaluate further.   Orders Placed This Encounter  Procedures  . US Abdomen Complete    Standing Status: Future     Number of Occurrences:      Standing Expiration Date: 03/01/2015    Order Specific Question:  Reason for Exam (SYMPTOM  OR DIAGNOSIS REQUIRED)    Answer:  pulsatile 3 cmmass just above cholecystectomy scar.    Order Specific Question:  Preferred imaging location?    Answer:  [facility]  . Pneumococcal polysaccharide vaccine 23-valent greater than or equal to 2yo subcutaneous/IM  . TSH    [place]  . Comprehensive metabolic panel      . PSA

## 2013-12-29 NOTE — Patient Instructions (Signed)
Cholesterol-recheck next visit. Continue atorvastatin.   Thyroid-check level today. Stay on synthroid 281mcg likely.   Prostate cancer screening-PSA today, rectal exam next visit  Pulsatile spot in abdomen-abdominal ultrasound( we will call you with appointment)  Updated your immunizations-pneumovax and flu

## 2013-12-29 NOTE — Assessment & Plan Note (Signed)
TSH today. Symptomatically euthyroid. Likely to refill synthroid (name brand) 258mcg.

## 2013-12-29 NOTE — Assessment & Plan Note (Addendum)
Well controlled with ldl 91 7 months ago. Continue atorvastatin. Check lipids next year.

## 2014-01-06 ENCOUNTER — Telehealth: Payer: Self-pay

## 2014-01-06 ENCOUNTER — Ambulatory Visit
Admission: RE | Admit: 2014-01-06 | Discharge: 2014-01-06 | Disposition: A | Discharge status: Home / Self Care | Payer: Medicare Other | Source: Ambulatory Visit | Attending: Family Medicine | Admitting: Family Medicine

## 2014-01-06 ENCOUNTER — Other Ambulatory Visit: Payer: Self-pay | Admitting: Family Medicine

## 2014-01-06 ENCOUNTER — Telehealth: Payer: Self-pay | Admitting: Family Medicine

## 2014-01-06 DIAGNOSIS — R1900 Intra-abdominal and pelvic swelling, mass and lump, unspecified site: Secondary | ICD-10-CM

## 2014-01-06 NOTE — Telephone Encounter (Signed)
awaiting authorization from  [facility] system is down was told to fax over clinical , can not schedule until  [place] office receive authorization . I faxed it today 01-06-2014 awaiting authorization as soon as I  can can approval I will schedule

## 2014-01-06 NOTE — Telephone Encounter (Signed)
error 

## 2014-01-06 NOTE — Telephone Encounter (Signed)
Ct abdomen ordered

## 2014-01-07 NOTE — Telephone Encounter (Signed)
Dr. Hunter see below 

## 2014-01-09 ENCOUNTER — Ambulatory Visit (INDEPENDENT_AMBULATORY_CARE_PROVIDER_SITE_OTHER)
Admission: RE | Admit: 2014-01-09 | Discharge: 2014-01-09 | Disposition: A | Discharge status: Home / Self Care | Payer: Medicare Other | Source: Ambulatory Visit | Attending: Family Medicine | Admitting: Family Medicine

## 2014-01-09 DIAGNOSIS — R1900 Intra-abdominal and pelvic swelling, mass and lump, unspecified site: Secondary | ICD-10-CM

## 2014-01-09 MED ORDER — IOHEXOL 300 MG/ML  SOLN
100.0000 mL | Freq: Once | INTRAMUSCULAR | Status: AC | PRN
  Administered 2014-01-09: 100 mL via INTRAVENOUS

## 2014-01-12 ENCOUNTER — Encounter: Payer: Self-pay | Admitting: Family Medicine

## 2014-01-12 DIAGNOSIS — R1900 Intra-abdominal and pelvic swelling, mass and lump, unspecified site: Secondary | ICD-10-CM | POA: Insufficient documentation

## 2014-02-11 ENCOUNTER — Telehealth: Payer: Self-pay | Admitting: Family Medicine

## 2014-02-11 MED ORDER — LEVOTHYROXINE SODIUM 200 MCG PO TABS: 200.0000 ug | ORAL_TABLET | Freq: Every day | ORAL | Status: DC

## 2014-02-11 NOTE — Telephone Encounter (Signed)
[facility] is requesting re-fill on SYNTHROID 200 MCG tablet

## 2014-02-11 NOTE — Telephone Encounter (Signed)
Medication refilled

## 2014-06-29 ENCOUNTER — Encounter: Payer: Self-pay | Admitting: Family Medicine

## 2014-06-29 ENCOUNTER — Ambulatory Visit (INDEPENDENT_AMBULATORY_CARE_PROVIDER_SITE_OTHER): Payer: Medicare Other | Admitting: Family Medicine

## 2014-06-29 VITALS — BP 130/80 | Temp 98.4°F | Wt 190.0 lb

## 2014-06-29 DIAGNOSIS — L989 Disorder of the skin and subcutaneous tissue, unspecified: Secondary | ICD-10-CM | POA: Insufficient documentation

## 2014-06-29 DIAGNOSIS — R1900 Intra-abdominal and pelvic swelling, mass and lump, unspecified site: Secondary | ICD-10-CM

## 2014-06-29 DIAGNOSIS — Z7251 High risk heterosexual behavior: Secondary | ICD-10-CM

## 2014-06-29 DIAGNOSIS — R739 Hyperglycemia, unspecified: Secondary | ICD-10-CM

## 2014-06-29 DIAGNOSIS — E039 Hypothyroidism, unspecified: Secondary | ICD-10-CM

## 2014-06-29 DIAGNOSIS — E785 Hyperlipidemia, unspecified: Secondary | ICD-10-CM

## 2014-06-29 LAB — COMPREHENSIVE METABOLIC PANEL
ALT: 20 U/L (ref 0–53)
AST: 18 U/L (ref 0–37)
Albumin: 4.2 g/dL (ref 3.5–5.2)
Alkaline Phosphatase: 90 U/L (ref 39–117)
BILIRUBIN TOTAL: 0.9 mg/dL (ref 0.2–1.2)
BUN: 17 mg/dL (ref 6–23)
CO2: 28 mEq/L (ref 19–32)
CREATININE: 0.9 mg/dL (ref 0.40–1.50)
Calcium: 9.5 mg/dL (ref 8.4–10.5)
Chloride: 105 mEq/L (ref 96–112)
GFR: 89.77 mL/min (ref >60.00)
GLUCOSE: 111 mg/dL — AB (ref 70–99)
Potassium: 4.1 mEq/L (ref 3.5–5.1)
Sodium: 141 mEq/L (ref 135–145)
Total Protein: 7 g/dL (ref 6.0–8.3)

## 2014-06-29 LAB — CBC
HEMATOCRIT: 44.1 % (ref 39.0–52.0)
HEMOGLOBIN: 15 g/dL (ref 13.0–17.0)
MCHC: 33.9 g/dL (ref 30.0–36.0)
MCV: 91.9 fl (ref 78.0–100.0)
Platelets: 233 10*3/uL (ref 150.0–400.0)
RBC: 4.8 Mil/uL (ref 4.22–5.81)
RDW: 12.9 % (ref 11.5–15.5)
WBC: 5.9 10*3/uL (ref 4.0–10.5)

## 2014-06-29 LAB — LIPID PANEL
Cholesterol: 163 mg/dL (ref 0–200)
HDL: 51.8 mg/dL (ref >39.00)
LDL Cholesterol: 78 mg/dL (ref 0–99)
NONHDL: 111.2
Total CHOL/HDL Ratio: 3
Triglycerides: 166 mg/dL — ABNORMAL HIGH (ref 0.0–149.0)
VLDL: 33.2 mg/dL (ref 0.0–40.0)

## 2014-06-29 LAB — HEMOGLOBIN A1C: HEMOGLOBIN A1C: 5.7 % (ref 4.6–6.5)

## 2014-06-29 LAB — TSH: TSH: 0.89 u[IU]/mL (ref 0.35–4.50)

## 2014-06-29 NOTE — Assessment & Plan Note (Signed)
Asymptomatic. We discussed options for surgery watch with imaging, not continue to follow with imaging. Patient today seems to be leaning toward no regular monitoring except physical exam and him monitoring at home. We will continue to check in each 6 months.

## 2014-06-29 NOTE — Patient Instructions (Addendum)
Let's keep an eye on that spot on your left side. If it grows or changes, I want to shave it off and send for biopsy.   Update essentially all fasting labs today. Also add 1x HIV screening based on national recommendations. We did this under unprotected sex although this was with your wife only.   Thyroid seems to be in good place.   Knot in abdomen also stable-continue to examine each visit  6 months follow up

## 2014-06-29 NOTE — Assessment & Plan Note (Signed)
Controlled on atorvastatin 40mg  with LDL today at 78.

## 2014-06-29 NOTE — Assessment & Plan Note (Signed)
Lab Results  Component Value Date   TSH 0.89 06/29/2014  Controlled today, continue Synthroid 218mcg.

## 2014-06-29 NOTE — Assessment & Plan Note (Signed)
Suspect seborrheic keratosis but do not like the color of lesion, we discussed watching closely and considering shave biopsy if any change.

## 2014-06-29 NOTE — Progress Notes (Signed)
Carl Reddish, MD Phone: [phone]  Subjective:   Carl Lopez is a 64 y.o. year old very pleasant male patient who presents with the following:  Hypothyroidism-controlled On thyroid medication-Synthroid 253mcg ROS-No hair or nail changes. No heat/cold intolerance. No constipation or diarrhea. Denies shakiness or anxiety.   Hyperlipidemia-good control on atorvastatin 40mg   Lab Results  Component Value Date   LDLCALC 78 06/29/2014  Regular exercise: Not exercising.  ROS- no chest pain or shortness of breath. No myalgias  Abdominal Mass We reviewed imaging and fact this could have been related to prior surgery. 87 cholecystectomy 88 incisional hernia repair. When lost weight  Around 2013 started to notice.No change since noticing ROS- no abdominal pain, nausea, vomiting  Skin Lesion Mole on L axilla 5 x 5.5 mm. Dark and raised.   Health Maintenance Due  Topic Date Due  . HIV Screening  08/15/1963    fasting Direct LDL TSH  Past Medical History- Patient Active Problem List   Diagnosis Date Noted  . Hypothyroidism 07/29/2007    Priority: Medium  . Hyperlipidemia 07/29/2007    Priority: Medium  . Skin lesion 06/29/2014    Priority: Low  . Abdominal mass 01/12/2014    Priority: Low  . SNORING 08/03/2008    Priority: Low  . COLONIC POLYPS, HX OF 07/29/2007    Priority: Low   Medications- reviewed and updated Current Outpatient Prescriptions  Medication Sig Dispense Refill  . aspirin 81 MG tablet Take 81 mg by mouth daily.      Marland Kitchen atorvastatin (LIPITOR) 40 MG tablet Take 1 tablet (40 mg total) by mouth daily. 90 tablet 3  . levothyroxine (SYNTHROID) 200 MCG tablet Take 1 tablet (200 mcg total) by mouth daily before breakfast. 90 tablet 1  . Multiple Vitamin (MULTIVITAMIN) capsule Take 1 capsule by mouth daily.       No current facility-administered medications for this visit.    Objective: BP 130/80 mmHg  Temp(Src) 98.4 F (36.9 C)  Wt 190 lb (86.183  kg) Gen: NAD, resting comfortably CV: RRR no murmurs rubs or gallops Lungs: CTAB no crackles, wheeze, rhonchi Abdomen: soft/nontender/nondistended/normal bowel sounds. No rebound or guarding. Under cholecystecomy scar-firm area noted unchanged from previous (corresponds to imaging) Ext: no edema Skin: warm, dry, L axilla 5 x 5.5 mm raised brown to dark black lesion   Assessment/Plan:  Hypothyroidism Lab Results  Component Value Date   TSH 0.89 06/29/2014  Controlled today, continue Synthroid 275mcg.    Hyperlipidemia Controlled on atorvastatin 40mg  with LDL today at 78.    Abdominal mass Asymptomatic. We discussed options for surgery watch with imaging, not continue to follow with imaging. Patient today seems to be leaning toward no regular monitoring except physical exam and him monitoring at home. We will continue to check in each 6 months.    Skin lesion Suspect seborrheic keratosis but do not like the color of lesion, we discussed watching closely and considering shave biopsy if any change.    6 month follow up.   Fasting labs today Results for orders placed or performed in visit on 06/29/14 (from the past 24 hour(s))  CBC     Status: None   Collection Time: 06/29/14 11:12 AM  Result Value Ref Range   WBC 5.9 4.0 - 10.5 K/uL   RBC 4.80 4.22 - 5.81 Mil/uL   Platelets 233.0 150.0 - 400.0 K/uL   Hemoglobin 15.0 13.0 - 17.0 g/dL   HCT 44.1 39.0 - 52.0 %   MCV  91.9 78.0 - 100.0 fl   MCHC 33.9 30.0 - 36.0 g/dL   RDW 12.9 11.5 - 15.5 %  Comprehensive metabolic panel     Status: Abnormal   Collection Time: 06/29/14 11:12 AM  Result Value Ref Range   Sodium 141 135 - 145 mEq/L   Potassium 4.1 3.5 - 5.1 mEq/L   Chloride 105 96 - 112 mEq/L   CO2 28 19 - 32 mEq/L   Glucose, Bld 111 (H) 70 - 99 mg/dL   BUN 17 6 - 23 mg/dL   Creatinine, Ser 0.90 0.40 - 1.50 mg/dL   Total Bilirubin 0.9 0.2 - 1.2 mg/dL   Alkaline Phosphatase 90 39 - 117 U/L   AST 18 0 - 37 U/L   ALT 20 0  - 53 U/L   Total Protein 7.0 6.0 - 8.3 g/dL   Albumin 4.2 3.5 - 5.2 g/dL   Calcium 9.5 8.4 - 10.5 mg/dL   GFR 89.77 >60.00 mL/min  Lipid panel     Status: Abnormal   Collection Time: 06/29/14 11:12 AM  Result Value Ref Range   Cholesterol 163 0 - 200 mg/dL   Triglycerides 166.0 (H) 0.0 - 149.0 mg/dL   HDL 51.80 >39.00 mg/dL   VLDL 33.2 0.0 - 40.0 mg/dL   LDL Cholesterol 78 0 - 99 mg/dL   Total CHOL/HDL Ratio 3    NonHDL 111.20   TSH     Status: None   Collection Time: 06/29/14 11:12 AM  Result Value Ref Range   TSH 0.89 0.35 - 4.50 uIU/mL  Hemoglobin A1c     Status: None   Collection Time: 06/29/14 11:12 AM  Result Value Ref Range   Hgb A1c MFr Bld 5.7 4.6 - 6.5 %

## 2014-06-30 LAB — HIV ANTIBODY (ROUTINE TESTING W REFLEX): HIV: NONREACTIVE

## 2014-08-21 ENCOUNTER — Encounter: Payer: Self-pay | Admitting: Gastroenterology

## 2014-09-21 ENCOUNTER — Telehealth: Payer: Self-pay | Admitting: Family Medicine

## 2014-09-21 DIAGNOSIS — E785 Hyperlipidemia, unspecified: Secondary | ICD-10-CM

## 2014-09-21 MED ORDER — ATORVASTATIN CALCIUM 40 MG PO TABS: 40.0000 mg | ORAL_TABLET | Freq: Every day | ORAL | Status: DC

## 2014-09-21 NOTE — Telephone Encounter (Signed)
[facility] - [place], [place] - [address]: atorvastatin (LIPITOR) 40 MG tablet

## 2014-09-21 NOTE — Telephone Encounter (Signed)
Medication refilled

## 2014-12-28 ENCOUNTER — Ambulatory Visit (INDEPENDENT_AMBULATORY_CARE_PROVIDER_SITE_OTHER): Payer: Medicare Other | Admitting: Family Medicine

## 2014-12-28 ENCOUNTER — Encounter: Payer: Self-pay | Admitting: Family Medicine

## 2014-12-28 VITALS — BP 132/86 | HR 67 | Temp 98.4°F | Wt 188.0 lb

## 2014-12-28 DIAGNOSIS — R1900 Intra-abdominal and pelvic swelling, mass and lump, unspecified site: Secondary | ICD-10-CM

## 2014-12-28 DIAGNOSIS — Z23 Encounter for immunization: Secondary | ICD-10-CM

## 2014-12-28 DIAGNOSIS — E785 Hyperlipidemia, unspecified: Secondary | ICD-10-CM

## 2014-12-28 DIAGNOSIS — R739 Hyperglycemia, unspecified: Secondary | ICD-10-CM

## 2014-12-28 DIAGNOSIS — E039 Hypothyroidism, unspecified: Secondary | ICD-10-CM | POA: Diagnosis not present

## 2014-12-28 DIAGNOSIS — N401 Enlarged prostate with lower urinary tract symptoms: Secondary | ICD-10-CM

## 2014-12-28 DIAGNOSIS — L989 Disorder of the skin and subcutaneous tissue, unspecified: Secondary | ICD-10-CM

## 2014-12-28 DIAGNOSIS — R351 Nocturia: Secondary | ICD-10-CM

## 2014-12-28 LAB — TSH: TSH: 0.19 u[IU]/mL — ABNORMAL LOW (ref 0.35–4.50)

## 2014-12-28 MED ORDER — LEVOTHYROXINE SODIUM 175 MCG PO TABS: ORAL_TABLET | ORAL | Status: DC

## 2014-12-28 MED ORDER — SYNTHROID 175 MCG PO TABS: 175.0000 ug | ORAL_TABLET | Freq: Every day | ORAL | Status: DC

## 2014-12-28 NOTE — Assessment & Plan Note (Signed)
Stable on exam. Opted to continue to watch with exam. Potential repeat imaging in future but not today.

## 2014-12-28 NOTE — Assessment & Plan Note (Signed)
Encouraged need for healthy eating, regular exercise, weight loss. a1c before next visit

## 2014-12-28 NOTE — Progress Notes (Addendum)
Garret Reddish, MD  Subjective:  Carl Lopez is a 64 y.o. year old very pleasant male patient who presents with:  See problem oriented charting ROS-No hair or nail changes. No cold intolerance. Does have some heat intolerance unsure if this is due to it being much hotter this summer. No constipation or diarrhea. Denies shakiness or anxiety. No chest pain, shortness of breath, myalgias. Nocturia once a night with history of enlarged prostate on exam.   Past Medical History-  Patient Active Problem List   Diagnosis Date Noted  . Hypothyroidism 07/29/2007    Priority: Medium  . Hyperlipidemia 07/29/2007    Priority: Medium  . Skin lesion 06/29/2014    Priority: Low  . Hyperglycemia 06/29/2014    Priority: Low  . Abdominal mass 01/12/2014    Priority: Low   Medications- reviewed and updated Current Outpatient Prescriptions  Medication Sig Dispense Refill  . aspirin 81 MG tablet Take 81 mg by mouth daily.      Marland Kitchen atorvastatin (LIPITOR) 40 MG tablet Take 1 tablet (40 mg total) by mouth daily. 90 tablet 3  . Multiple Vitamin (MULTIVITAMIN) capsule Take 1 capsule by mouth daily.      Marland Kitchen SYNTHROID 200 MCG tablet Take 1 tablet by mouth  daily before breakfast 90 tablet 2   No current facility-administered medications for this visit.   Objective: BP 130/90 mmHg  Pulse 67  Temp(Src) 98.4 F (36.9 C)  Wt 188 lb (85.276 kg) Gen: NAD, resting comfortably CV: RRR no murmurs rubs or gallops Lungs: CTAB no crackles, wheeze, rhonchi Abdomen: soft/nontender/nondistended/normal bowel sounds. No rebound or guarding. Under cholecystecomy scar-firm area noted unchanged from previous (corresponds to imaging) Ext: no edema Skin: warm, dry, L axilla 5 x 5.5 mm raised brown to dark black lesion. 1-2 cm seborrheic keratosis left lower leg posterior   Assessment/Plan:  Hypothyroidism S: previously controlled. Some warmth intolerance.  A/P:Continue current meds. Check tsh   Hyperlipidemia S:  controlled. With LDL <100 A/P:Continue current meds:  Atorvastatin 40mg    Skin lesion Stable on exam. Below L axilla 5 x 5.5 mm raised brown to dark black lesion. Also has SK on right lower leg posterior with no recent change. Follow both.   Hyperglycemia Encouraged need for healthy eating, regular exercise, weight loss. a1c before next visit  Abdominal mass Stable on exam. Opted to continue to watch with exam. Potential repeat imaging in future but not today.   6 month CPE with fasting labs before visit  Orders Placed This Encounter  Procedures  . Flu Vaccine QUAD 36+ mos IM  . TSH    [place]  . CBC    [place]    Standing Status: Future     Number of Occurrences:      Standing Expiration Date: 12/28/2015  . Comprehensive metabolic panel    [place]    Standing Status: Future     Number of Occurrences:      Standing Expiration Date: 12/28/2015    Order Specific Question:  Has the patient fasted?    Answer:  No  . Lipid panel    [place]    Standing Status: Future     Number of Occurrences:      Standing Expiration Date: 12/28/2015    Order Specific Question:  Has the patient fasted?    Answer:  No  . TSH    [place]    Standing Status: Future     Number of Occurrences:      Standing  Expiration Date: 12/28/2015  . PSA    Standing Status: Future     Number of Occurrences:      Standing Expiration Date: 12/28/2015  . Hemoglobin A1c    [place]    Standing Status: Future     Number of Occurrences:      Standing Expiration Date: 12/28/2015   Addendum: TSH came back suppressed, reduce synthroid to 184mcg from 270mcg

## 2014-12-28 NOTE — Assessment & Plan Note (Signed)
S: previously controlled. Some warmth intolerance.  A/P:Continue current meds. Check tsh

## 2014-12-28 NOTE — Addendum Note (Signed)
Addended by: Marin Olp on: 12/28/2014 05:47 PM   Modules accepted: Orders

## 2014-12-28 NOTE — Assessment & Plan Note (Signed)
Stable on exam. Below L axilla 5 x 5.5 mm raised brown to dark black lesion. Also has SK on right lower leg posterior with no recent change. Follow both.

## 2014-12-28 NOTE — Patient Instructions (Signed)
Medication Instructions:  No changes  Labwork: TSH today Labs on 06/30/15 or later for physical  Testing/Procedures/Immunizations: Flu shot today  Follow-Up (all visit scheduling, rescheduling, cancellations including labs should be scheduled at front desk): After labs for physical.   Continue to watch abdomen and spot on left side- appear unchanged today

## 2014-12-28 NOTE — Assessment & Plan Note (Signed)
S: controlled. With LDL <100 A/P:Continue current meds:  Atorvastatin 40mg 

## 2015-05-02 HISTORY — PX: COLONOSCOPY: SHX174

## 2015-05-19 ENCOUNTER — Other Ambulatory Visit: Payer: Self-pay | Admitting: Family Medicine

## 2015-06-19 ENCOUNTER — Emergency Department (HOSPITAL_COMMUNITY): Payer: Medicare Other

## 2015-06-19 ENCOUNTER — Emergency Department (HOSPITAL_COMMUNITY)
Admission: EM | Admit: 2015-06-19 | Discharge: 2015-06-19 | Disposition: A | Discharge status: Home / Self Care | Payer: Medicare Other | Attending: Emergency Medicine | Admitting: Emergency Medicine

## 2015-06-19 ENCOUNTER — Encounter (HOSPITAL_COMMUNITY): Payer: Self-pay | Admitting: Emergency Medicine

## 2015-06-19 DIAGNOSIS — Z880 Allergy status to penicillin: Secondary | ICD-10-CM | POA: Diagnosis not present

## 2015-06-19 DIAGNOSIS — N201 Calculus of ureter: Secondary | ICD-10-CM | POA: Insufficient documentation

## 2015-06-19 DIAGNOSIS — E785 Hyperlipidemia, unspecified: Secondary | ICD-10-CM | POA: Insufficient documentation

## 2015-06-19 DIAGNOSIS — Z79899 Other long term (current) drug therapy: Secondary | ICD-10-CM | POA: Diagnosis not present

## 2015-06-19 DIAGNOSIS — S30851D Superficial foreign body of abdominal wall, subsequent encounter: Secondary | ICD-10-CM | POA: Diagnosis not present

## 2015-06-19 DIAGNOSIS — R109 Unspecified abdominal pain: Secondary | ICD-10-CM | POA: Diagnosis present

## 2015-06-19 DIAGNOSIS — Z7982 Long term (current) use of aspirin: Secondary | ICD-10-CM | POA: Diagnosis not present

## 2015-06-19 DIAGNOSIS — X58XXXD Exposure to other specified factors, subsequent encounter: Secondary | ICD-10-CM | POA: Diagnosis not present

## 2015-06-19 DIAGNOSIS — Z8719 Personal history of other diseases of the digestive system: Secondary | ICD-10-CM | POA: Diagnosis not present

## 2015-06-19 LAB — CBC
HCT: 45.6 % (ref 39.0–52.0)
Hemoglobin: 16.1 g/dL (ref 13.0–17.0)
MCH: 32.3 pg (ref 26.0–34.0)
MCHC: 35.3 g/dL (ref 30.0–36.0)
MCV: 91.6 fL (ref 78.0–100.0)
Platelets: 243 10*3/uL (ref 150–400)
RBC: 4.98 MIL/uL (ref 4.22–5.81)
RDW: 12.7 % (ref 11.5–15.5)
WBC: 16 10*3/uL — ABNORMAL HIGH (ref 4.0–10.5)

## 2015-06-19 LAB — COMPREHENSIVE METABOLIC PANEL
ALBUMIN: 4.4 g/dL (ref 3.5–5.0)
ALK PHOS: 87 U/L (ref 38–126)
ALT: 16 U/L — AB (ref 17–63)
ANION GAP: 11 (ref 5–15)
AST: 23 U/L (ref 15–41)
BILIRUBIN TOTAL: 1.6 mg/dL — AB (ref 0.3–1.2)
BUN: 14 mg/dL (ref 6–20)
CALCIUM: 9.5 mg/dL (ref 8.9–10.3)
CO2: 24 mmol/L (ref 22–32)
Chloride: 105 mmol/L (ref 101–111)
Creatinine, Ser: 1.07 mg/dL (ref 0.61–1.24)
GFR calc Af Amer: >60 mL/min (ref >60)
GFR calc non Af Amer: >60 mL/min (ref >60)
GLUCOSE: 129 mg/dL — AB (ref 65–99)
Potassium: 3.4 mmol/L — ABNORMAL LOW (ref 3.5–5.1)
Sodium: 140 mmol/L (ref 135–145)
Total Protein: 7.2 g/dL (ref 6.5–8.1)

## 2015-06-19 LAB — LIPASE, BLOOD: Lipase: 25 U/L (ref 11–51)

## 2015-06-19 LAB — URINALYSIS, ROUTINE W REFLEX MICROSCOPIC
BILIRUBIN URINE: NEGATIVE
Glucose, UA: NEGATIVE mg/dL
HGB URINE DIPSTICK: NEGATIVE
Leukocytes, UA: NEGATIVE
Nitrite: NEGATIVE
PH: 6.5 (ref 5.0–8.0)
Protein, ur: NEGATIVE mg/dL
Specific Gravity, Urine: 1.025 (ref 1.005–1.030)

## 2015-06-19 MED ORDER — TAMSULOSIN HCL 0.4 MG PO CAPS: ORAL_CAPSULE | ORAL | Status: DC

## 2015-06-19 MED ORDER — ONDANSETRON HCL 4 MG/2ML IJ SOLN
4.0000 mg | Freq: Once | INTRAMUSCULAR | Status: AC
  Administered 2015-06-19: 4 mg via INTRAVENOUS
  Filled 2015-06-19: qty 2

## 2015-06-19 MED ORDER — OXYCODONE-ACETAMINOPHEN 5-325 MG PO TABS: 1.0000 | ORAL_TABLET | ORAL | Status: DC | PRN

## 2015-06-19 MED ORDER — SODIUM CHLORIDE 0.9 % IV SOLN
INTRAVENOUS | Status: DC
  Administered 2015-06-19: 11:00:00 via INTRAVENOUS

## 2015-06-19 MED ORDER — HYDROMORPHONE HCL 1 MG/ML IJ SOLN
1.0000 mg | Freq: Once | INTRAMUSCULAR | Status: AC
  Administered 2015-06-19: 1 mg via INTRAVENOUS
  Filled 2015-06-19: qty 1

## 2015-06-19 MED ORDER — HYDROMORPHONE HCL 1 MG/ML IJ SOLN
0.5000 mg | Freq: Once | INTRAMUSCULAR | Status: AC
Start: 2015-06-19 — End: 2015-06-19
  Administered 2015-06-19: 0.5 mg via INTRAVENOUS
  Filled 2015-06-19: qty 1

## 2015-06-19 NOTE — ED Notes (Signed)
RN Lilia Pro starting IV currently

## 2015-06-19 NOTE — ED Provider Notes (Signed)
CSN: [id number]     Arrival date & time 06/19/15  [id number] History   First MD Initiated Contact with Patient 06/19/15 1012     Chief Complaint  Patient presents with  . Abdominal Pain  . Emesis     (Consider location/radiation/quality/duration/timing/severity/associated sxs/prior Treatment) HPI   Carl Lopez is a 65 y.o. male who presents for evaluation of vomiting followed by right flank pain, since 4 PM yesterday. He has a sensation of being unable to urinate. He denies preceding dysuria, hematuria, diarrhea, fever, chills or similar problems, in the past. He and his wife shared a sandwich prior to onset of the process. She is not ill. He does not feel like he can eat. There are no other known modifying factors.   Past Medical History  Diagnosis Date  . Thyroid disease     65years old, meds 2-3 years  . Hyperlipidemia   . DIVERTICULOSIS, COLON 07/29/2007  . BACK PAIN 01/26/2009    1987     Past Surgical History  Procedure Laterality Date  . Inguinal hernia repair  05-31-10  . Cholecystectomy  1988  . Incisional hernia repair  1988  . Polypectomy    . Colonoscopy     Family History  Problem Relation Age of Onset  . Colon cancer Maternal Uncle   . Colon cancer Other   . Breast cancer Mother     1  . Dementia Father     94  . CVA Father    Social History  Substance Use Topics  . Smoking status: Never Smoker   . Smokeless tobacco: None  . Alcohol Use: 3.0 oz/week    5 Glasses of wine per week    Review of Systems  All other systems reviewed and are negative.     Allergies  Penicillins  Home Medications   Prior to Admission medications   Medication Sig Start Date End Date Taking? Authorizing Provider  aspirin 81 MG tablet Take 81 mg by mouth daily.     Yes Historical Provider, MD  atorvastatin (LIPITOR) 40 MG tablet Take 1 tablet (40 mg total) by mouth daily. 09/21/14  Yes Marin Olp, MD  Multiple Vitamins-Minerals (PRESERVISION AREDS 2 PO) Take 1  tablet by mouth daily.   Yes Historical Provider, MD  SYNTHROID 200 MCG tablet Take 1 tablet by mouth  daily before breakfast 05/19/15  Yes Marin Olp, MD  oxyCODONE-acetaminophen (PERCOCET) 5-325 MG tablet Take 1 tablet by mouth every 4 (four) hours as needed for severe pain. 06/19/15   Daleen Bo, MD  SYNTHROID 175 MCG tablet Take 1 tablet (175 mcg total) by mouth daily before breakfast. Please ignore generic prescription Patient not taking: Reported on 06/19/2015 12/28/14   Marin Olp, MD  tamsulosin [facility]) 0.4 MG CAPS capsule 1 q HS to aid stone passage 06/19/15   Daleen Bo, MD   BP 133/90 mmHg  Pulse 70  Temp(Src) 97.8 F (36.6 C) (Oral)  Resp 16  SpO2 96% Physical Exam  Constitutional: He is oriented to person, place, and time. He appears well-developed and well-nourished. No distress.  HENT:  Head: Normocephalic and atraumatic.  Right Ear: External ear normal.  Left Ear: External ear normal.  Eyes: Conjunctivae and EOM are normal. Pupils are equal, round, and reactive to light.  Neck: Normal range of motion and phonation normal. Neck supple.  Cardiovascular: Normal rate, regular rhythm and normal heart sounds.   Pulmonary/Chest: Effort normal and breath sounds normal. No respiratory distress.  He exhibits no bony tenderness.  Abdominal: Soft. He exhibits no distension and no mass. There is no tenderness. There is no guarding.  Genitourinary:  No costovertebral angle tenderness, with percussion.  Musculoskeletal: Normal range of motion.  Neurological: He is alert and oriented to person, place, and time. No cranial nerve deficit or sensory deficit. He exhibits normal muscle tone. Coordination normal.  Skin: Skin is warm, dry and intact.  Psychiatric: He has a normal mood and affect. His behavior is normal. Judgment and thought content normal.  Nursing note and vitals reviewed.   ED Course  Procedures (including critical care time)  Medications  0.9 %  sodium  chloride infusion ( Intravenous New Bag/Given 06/19/15 1058)  ondansetron (ZOFRAN) injection 4 mg (4 mg Intravenous Given 06/19/15 1058)  HYDROmorphone (DILAUDID) injection 1 mg (1 mg Intravenous Given 06/19/15 1058)  HYDROmorphone (DILAUDID) injection 0.5 mg (0.5 mg Intravenous Given 06/19/15 1350)    Patient Vitals for the past 24 hrs:  BP Temp Temp src Pulse Resp SpO2  06/19/15 1212 133/90 mmHg - - 70 16 96 %  06/19/15 1144 158/90 mmHg - - 75 18 98 %  06/19/15 0935 125/85 mmHg 97.8 F (36.6 C) Oral 101 16 100 %    2:06 PM Reevaluation with update and discussion. After initial assessment and treatment, an updated evaluation reveals pain is somewhat better, required a second dose of Dilaudid. Patient has known about his CT abnormality from his prior scan and feels it is related to his abdominal surgery, which was done about 30 years ago. Findings discussed with patient and family members, all questions answered. Debbera Wolken L    Labs Review Labs Reviewed  COMPREHENSIVE METABOLIC PANEL - Abnormal; Notable for the following:    Potassium 3.4 (*)    Glucose, Bld 129 (*)    ALT 16 (*)    Total Bilirubin 1.6 (*)    All other components within normal limits  CBC - Abnormal; Notable for the following:    WBC 16.0 (*)    All other components within normal limits  URINALYSIS, ROUTINE W REFLEX MICROSCOPIC (NOT AT [facility]) - Abnormal; Notable for the following:    Ketones, ur >80 (*)    All other components within normal limits  LIPASE, BLOOD    Imaging Review Ct Renal Stone Study  06/19/2015  CLINICAL DATA:  Acute right flank pain with vomiting EXAM: CT ABDOMEN AND PELVIS WITHOUT CONTRAST TECHNIQUE: Multidetector CT imaging of the abdomen and pelvis was performed following the standard protocol without IV contrast. COMPARISON:  01/09/2014 FINDINGS: Lower chest: Minimal dependent bibasilar atelectasis. Normal heart size. No pericardial or pleural effusion. Small hiatal hernia noted. Degenerative  changes of the lower thoracic spine. Hepatobiliary: Remote cholecystectomy. No biliary dilatation or focal hepatic abnormality within the limits of noncontrast imaging. Pancreas: No mass or inflammatory process identified on this un-enhanced exam. Spleen: Within normal limits in size. Adrenals/Urinary Tract: Normal adrenal glands. Right kidney demonstrates perinephric strandy edema and mild hydroureteronephrosis. This is secondary to a 3 mm obstructing right UVJ calculus in the pelvis, image 90. Additional punctate small renal calculi in the right kidney upper pole measuring 5 mm in greatest diameter. Left kidney demonstrates tiny punctate 1- 2 mm calculus in the upper pole without obstruction. Left ureter is decompressed and normal. No obstructing left urinary tract calculus or hydronephrosis. Stomach/Bowel: Diffuse colonic diverticulosis. No acute inflammatory process or obstruction. Stable well-defined rectangular soft tissue structure of the ventral abdominal wall extending into the peritoneal fat  as before possibly postoperative. No other acute finding. No free air evident. Vascular/Lymphatic: No pathologically enlarged lymph nodes. No evidence of abdominal aortic aneurysm. Reproductive: No mass or other significant abnormality. Other: Postop changes from right inguinal hernia repair. No recurrent hernia or inguinal adenopathy. Musculoskeletal: Degenerative changes of the spine. L5-S1 vacuum disc phenomenon noted. No acute compression fracture. IMPRESSION: Acute 3 mm obstructing right distal UVJ calculus with right hydroureteronephrosis. Additional nonobstructing punctate intrarenal calculi bilaterally. Colonic diverticulosis without acute inflammatory process Prior cholecystectomy Stable appearance of the indeterminate rectangular anterior abdominal wall abnormality extending into the peritoneal fat, likely postoperative. No surrounding inflammatory process. Difficult to exclude foreign body as previously  mentioned. Electronically Signed   By: Jerilynn Mages.  Shick M.D.   On: 06/19/2015 11:07   I have personally reviewed and evaluated these images and lab results as part of my medical decision-making.   EKG Interpretation None      MDM   Final diagnoses:  Right ureteral stone  Foreign body of abdominal wall, subsequent encounter    Ureteral stone, right, with colic, no evidence for infection. Incidental abdominal wall abnormality, previously defined on CT imaging, 2 years ago. Doubt serious bacterial infection, metabolic instability, or impending vascular collapse. Abdominal wall foreign body, is incidental and will not require intervention as it is asymptomatic.   Nursing Notes Reviewed/ Care Coordinated Applicable Imaging Reviewed Interpretation of Laboratory Data incorporated into ED treatment  The patient appears reasonably screened and/or stabilized for discharge and I doubt any other medical condition or other [facility] requiring further screening, evaluation, or treatment in the ED at this time prior to discharge.  Plan: Home Medications- Percocet, Flomax; Home Treatments- rest, strain urine to catch the stone; return here if the recommended treatment, does not improve the symptoms; Recommended follow up- Urology 1 week.    Daleen Bo, MD 06/19/15 1414

## 2015-06-19 NOTE — Discharge Instructions (Signed)
Strain the urine, to catch the stone. Return here, if needed, for problems.   Kidney Stones Kidney stones (urolithiasis) are deposits that form inside your kidneys. The intense pain is caused by the stone moving through the urinary tract. When the stone moves, the ureter goes into spasm around the stone. The stone is usually passed in the urine.  CAUSES   A disorder that makes certain neck glands produce too much parathyroid hormone (primary hyperparathyroidism).  A buildup of uric acid crystals, similar to gout in your joints.  Narrowing (stricture) of the ureter.  A kidney obstruction present at birth (congenital obstruction).  Previous surgery on the kidney or ureters.  Numerous kidney infections. SYMPTOMS   Feeling sick to your stomach (nauseous).  Throwing up (vomiting).  Blood in the urine (hematuria).  Pain that usually spreads (radiates) to the groin.  Frequency or urgency of urination. DIAGNOSIS   Taking a history and physical exam.  Blood or urine tests.  CT scan.  Occasionally, an examination of the inside of the urinary bladder (cystoscopy) is performed. TREATMENT   Observation.  Increasing your fluid intake.  Extracorporeal shock wave lithotripsy--This is a noninvasive procedure that uses shock waves to break up kidney stones.  Surgery may be needed if you have severe pain or persistent obstruction. There are various surgical procedures. Most of the procedures are performed with the use of small instruments. Only small incisions are needed to accommodate these instruments, so recovery time is minimized. The size, location, and chemical composition are all important variables that will determine the proper choice of action for you. Talk to your health care provider to better understand your situation so that you will minimize the risk of injury to yourself and your kidney.  HOME CARE INSTRUCTIONS   Drink enough water and fluids to keep your urine clear  or pale yellow. This will help you to pass the stone or stone fragments.  Strain all urine through the provided strainer. Keep all particulate matter and stones for your health care provider to see. The stone causing the pain may be as small as a grain of salt. It is very important to use the strainer each and every time you pass your urine. The collection of your stone will allow your health care provider to analyze it and verify that a stone has actually passed. The stone analysis will often identify what you can do to reduce the incidence of recurrences.  Only take over-the-counter or prescription medicines for pain, discomfort, or fever as directed by your health care provider.  Keep all follow-up visits as told by your health care provider. This is important.  Get follow-up X-rays if required. The absence of pain does not always mean that the stone has passed. It may have only stopped moving. If the urine remains completely obstructed, it can cause loss of kidney function or even complete destruction of the kidney. It is your responsibility to make sure X-rays and follow-ups are completed. Ultrasounds of the kidney can show blockages and the status of the kidney. Ultrasounds are not associated with any radiation and can be performed easily in a matter of minutes.  Make changes to your daily diet as told by your health care provider. You may be told to:  Limit the amount of salt that you eat.  Eat 5 or more servings of fruits and vegetables each day.  Limit the amount of meat, poultry, fish, and eggs that you eat.  Collect a 24-hour urine sample as  told by your health care provider.You may need to collect another urine sample every 6-12 months. SEEK MEDICAL CARE IF:  You experience pain that is progressive and unresponsive to any pain medicine you have been prescribed. SEEK IMMEDIATE MEDICAL CARE IF:   Pain cannot be controlled with the prescribed medicine.  You have a fever or shaking  chills.  The severity or intensity of pain increases over 18 hours and is not relieved by pain medicine.  You develop a new onset of abdominal pain.  You feel faint or pass out.  You are unable to urinate.   This information is not intended to replace advice given to you by your health care provider. Make sure you discuss any questions you have with your health care provider.   Document Released: 04/17/2005 Document Revised: 01/06/2015 Document Reviewed: 09/18/2012 Elsevier Interactive Patient Education [employer].

## 2015-06-19 NOTE — ED Notes (Addendum)
Began vomiting yesterday at 2:30 with onset of generalized abd pain radiating into back. States he feels the urge to urinate and defecate but cannot. Last normal BM was yesterday around 12:30. Hx of hernias, cholecystectomy. Pt states emesis was a green and black, no coffee ground-like emesis. Pain in RLQ, abd soft, non-tender/not distended. Pain in right flank, no tenderness. States he's also been having new urinary retention since this started.

## 2015-06-19 NOTE — ED Notes (Signed)
Pt cannot use restroom at this time, aware urine specimen is needed. Urinal provided  

## 2015-06-21 ENCOUNTER — Telehealth: Payer: Self-pay

## 2015-06-21 NOTE — Telephone Encounter (Signed)
[facility] Night - Client TELEPHONE [facility] Patient Name: MARKIEL BATTERSBY Gender: Male DOB: 04/27/1951 Age: 65 Y 10 M 2 D Return Phone Number: [phone] (Primary), [phone] (Secondary) Address: City/State/Zip: [place] Client [facility] Night - Client Client Site [facility] - Night Physician Garret Reddish Contact Type Call Who Is Calling Patient / Member / Family / Caregiver Call Type Triage / Clinical Caller Name Tervon Kuyper Relationship To Patient Spouse Return Phone Number [phone] (Primary) Chief Complaint ABDOMINAL PAIN - Severe and only in abdomen Reason for Call Symptomatic / Request for Jerome states husband c/o nausea, vomiting, severe lower right quad pain PreDisposition Did not know what to do Translation No Nurse Assessment Nurse: Raphael Gibney, RN, Vanita Ingles Date/Time (Eastern Time): 06/19/2015 8:52:15 AM Confirm and document reason for call. If symptomatic, describe symptoms. You must click the next button to save text entered. ---Caller states spouse is having severe right lower quadrant pain which radiates to his back. He has vomited yesterday and vomited up green emesis. Pain is constant. No fever. Pain level 6. Has the patient traveled out of the country within the last 30 days? ---No Does the patient have any new or worsening symptoms? ---Yes Will a triage be completed? ---Yes Related visit to physician within the last 2 weeks? ---No Does the PT have any chronic conditions? (i.e. diabetes, asthma, etc.) ---Yes List chronic conditions. ---hypothyroidism Is this a behavioral health or substance abuse call? ---No Guidelines Guideline Title Affirmed Question Affirmed Notes Nurse Date/Time (Eastern Time) Abdominal Pain - Male [1] Vomiting AND [2] contains red blood or black ("coffee ground") material (Exception: few red  streaks in vomit that only happened once) Raphael Gibney, Therapist, sports, Vanita Ingles 06/19/2015 8:53:56 AM PLEASE NOTE: All timestamps contained within this report are represented as [place] Standard Time. CONFIDENTIALTY NOTICE: This fax transmission is intended only for the addressee. It contains information that is legally privileged, confidential or otherwise protected from use or disclosure. If you are not the intended recipient, you are strictly prohibited from reviewing, disclosing, copying using or disseminating any of this information or taking any action in reliance on or regarding this information. If you have received this fax in error, please notify us immediately by telephone so that we can arrange for its return to [place]. Phone: [phone], Toll-Free: [phone], Fax: [phone] Page: 2 of 2 Call Id: [id number] [place]. Time Eilene Ghazi Time) Disposition Final User 06/19/2015 8:49:07 AM Send to Urgent Rich Brave, Amy 06/19/2015 8:56:42 AM Go to ED Now Yes Raphael Gibney, RN, Doreatha Lew Understands: Yes Disagree/Comply: Comply Care Advice Given Per Guideline GO TO ED NOW: You need to be seen in the Emergency Department. Go to the ER at ___________ [place] now. Drive carefully. DRIVING: Another adult should drive. Do not delay going to the Emergency Department. If immediate transportation is not available via car or taxi, then the patient should be instructed to call [id number]. SAMPLE: Bring in a sample of anything that looks like blood. (Reason: for testing) CARE ADVICE given per Abdominal Pain, Male (Adult) guideline. NOTHING BY MOUTH: Do not eat or drink anything for now. BRING MEDICINES: * Please bring a list of your current medicines when you go to the Emergency Department (ER). * It is also a good idea to bring the pill bottles too. This will help the doctor to make certain you are taking the right medicines and the right dose. Referrals [facility] - ED

## 2015-06-21 NOTE — Telephone Encounter (Signed)
Patient was treated at ED - Routed to Dr. Ansel Bong CMA for further follow up if needed.

## 2015-06-23 ENCOUNTER — Other Ambulatory Visit (INDEPENDENT_AMBULATORY_CARE_PROVIDER_SITE_OTHER): Payer: Medicare Other

## 2015-06-23 DIAGNOSIS — Z0000 Encounter for general adult medical examination without abnormal findings: Secondary | ICD-10-CM

## 2015-06-23 DIAGNOSIS — R739 Hyperglycemia, unspecified: Secondary | ICD-10-CM | POA: Diagnosis not present

## 2015-06-23 DIAGNOSIS — E785 Hyperlipidemia, unspecified: Secondary | ICD-10-CM

## 2015-06-23 DIAGNOSIS — R351 Nocturia: Secondary | ICD-10-CM | POA: Diagnosis not present

## 2015-06-23 DIAGNOSIS — E039 Hypothyroidism, unspecified: Secondary | ICD-10-CM | POA: Diagnosis not present

## 2015-06-23 DIAGNOSIS — N401 Enlarged prostate with lower urinary tract symptoms: Secondary | ICD-10-CM

## 2015-06-23 LAB — CBC
HCT: 42.8 % (ref 39.0–52.0)
Hemoglobin: 14.6 g/dL (ref 13.0–17.0)
MCHC: 34 g/dL (ref 30.0–36.0)
MCV: 92.2 fl (ref 78.0–100.0)
PLATELETS: 255 10*3/uL (ref 150.0–400.0)
RBC: 4.64 Mil/uL (ref 4.22–5.81)
RDW: 13.6 % (ref 11.5–15.5)
WBC: 5.1 10*3/uL (ref 4.0–10.5)

## 2015-06-23 LAB — LIPID PANEL
CHOL/HDL RATIO: 3
Cholesterol: 136 mg/dL (ref 0–200)
HDL: 50.3 mg/dL (ref >39.00)
LDL CALC: 70 mg/dL (ref 0–99)
NonHDL: 85.46
Triglycerides: 78 mg/dL (ref 0.0–149.0)
VLDL: 15.6 mg/dL (ref 0.0–40.0)

## 2015-06-23 LAB — HEMOGLOBIN A1C: Hgb A1c MFr Bld: 5.5 % (ref 4.6–6.5)

## 2015-06-23 LAB — COMPREHENSIVE METABOLIC PANEL
ALBUMIN: 4 g/dL (ref 3.5–5.2)
ALT: 28 U/L (ref 0–53)
AST: 21 U/L (ref 0–37)
Alkaline Phosphatase: 141 U/L — ABNORMAL HIGH (ref 39–117)
BUN: 16 mg/dL (ref 6–23)
CHLORIDE: 105 meq/L (ref 96–112)
CO2: 31 meq/L (ref 19–32)
CREATININE: 0.89 mg/dL (ref 0.40–1.50)
Calcium: 9.2 mg/dL (ref 8.4–10.5)
GFR: 90.66 mL/min (ref >60.00)
Glucose, Bld: 110 mg/dL — ABNORMAL HIGH (ref 70–99)
Potassium: 4.6 mEq/L (ref 3.5–5.1)
SODIUM: 142 meq/L (ref 135–145)
Total Bilirubin: 0.8 mg/dL (ref 0.2–1.2)
Total Protein: 6.3 g/dL (ref 6.0–8.3)

## 2015-06-23 LAB — TSH: TSH: 0.58 u[IU]/mL (ref 0.35–4.50)

## 2015-06-23 LAB — POC URINALSYSI DIPSTICK (AUTOMATED)
Bilirubin, UA: NEGATIVE
Blood, UA: NEGATIVE
GLUCOSE UA: NEGATIVE
KETONES UA: NEGATIVE
Leukocytes, UA: NEGATIVE
Nitrite, UA: NEGATIVE
SPEC GRAV UA: 1.025
Urobilinogen, UA: 1
pH, UA: 5.5

## 2015-06-23 LAB — PSA: PSA: 0.55 ng/mL (ref 0.10–4.00)

## 2015-06-30 ENCOUNTER — Encounter: Payer: Self-pay | Admitting: Family Medicine

## 2015-06-30 ENCOUNTER — Ambulatory Visit (INDEPENDENT_AMBULATORY_CARE_PROVIDER_SITE_OTHER): Payer: Medicare Other | Admitting: Family Medicine

## 2015-06-30 VITALS — BP 122/70 | HR 70 | Temp 98.8°F | Ht 71.0 in | Wt 186.0 lb

## 2015-06-30 DIAGNOSIS — Z0000 Encounter for general adult medical examination without abnormal findings: Secondary | ICD-10-CM | POA: Diagnosis not present

## 2015-06-30 DIAGNOSIS — L989 Disorder of the skin and subcutaneous tissue, unspecified: Secondary | ICD-10-CM

## 2015-06-30 DIAGNOSIS — N200 Calculus of kidney: Secondary | ICD-10-CM

## 2015-06-30 DIAGNOSIS — Z23 Encounter for immunization: Secondary | ICD-10-CM | POA: Diagnosis not present

## 2015-06-30 NOTE — Progress Notes (Signed)
Carl Reddish, MD Phone: [phone]  Subjective:  Patient presents today for their annual physical. Chief complaint-noted.   See problem oriented charting- ROS- full  review of systems was completed and negative except for pain from recent kidney stone. No chest pain or shortness of breath. No headache or blurry vision.  No hair or nail changes. No heat/cold intolerance. No constipation or diarrhea. Denies shakiness or anxiety.    The following were reviewed and entered/updated in epic: Past Medical History  Diagnosis Date  . Thyroid disease     65years old, meds 2-3 years  . Hyperlipidemia   . DIVERTICULOSIS, COLON 07/29/2007  . BACK PAIN 01/26/2009    1987     Patient Active Problem List   Diagnosis Date Noted  . Hypothyroidism 07/29/2007    Priority: Medium  . Hyperlipidemia 07/29/2007    Priority: Medium  . Skin lesion 06/29/2014    Priority: Low  . Hyperglycemia 06/29/2014    Priority: Low  . Abdominal mass 01/12/2014    Priority: Low  . SNORING 08/03/2008    Priority: Low  . COLONIC POLYPS, HX OF 07/29/2007    Priority: Low  . Nephrolithiasis 06/30/2015   Past Surgical History  Procedure Laterality Date  . Inguinal hernia repair  05-31-10  . Cholecystectomy  1988  . Incisional hernia repair  1988  . Polypectomy    . Colonoscopy      Family History  Problem Relation Age of Onset  . Colon cancer Maternal Uncle   . Colon cancer Other   . Breast cancer Mother     81  . Dementia Father     36  . CVA Father     Medications- reviewed and updated Current Outpatient Prescriptions  Medication Sig Dispense Refill  . aspirin 81 MG tablet Take 81 mg by mouth daily.      Marland Kitchen atorvastatin (LIPITOR) 40 MG tablet Take 1 tablet (40 mg total) by mouth daily. 90 tablet 3  . Multiple Vitamins-Minerals (PRESERVISION AREDS 2 PO) Take 1 tablet by mouth daily.    Marland Kitchen SYNTHROID 200 MCG tablet Take 1 tablet by mouth  daily before breakfast 90 tablet 2  . tamsulosin (FLOMAX)  0.4 MG CAPS capsule 1 q HS to aid stone passage 7 capsule 0   No current facility-administered medications for this visit.    Allergies-reviewed and updated Allergies  Allergen Reactions  . Penicillins Swelling and Other (See Comments)    Unknown Has patient had a PCN reaction causing immediate rash, facial/tongue/throat swelling, SOB or lightheadedness with hypotension:  unknown  Has patient had a PCN reaction causing severe rash involving mucus membranes or skin necrosis: unknown Has patient had a PCN reaction that required hospitalization : unknown,  Has patient had a PCN reaction occurring within the last 10 years: No Childhood allergy, swelling of arm after injection of penicillin    Social History   Social History  . Marital Status: Married    Spouse Name: N/A  . Number of Children: N/A  . Years of Education: N/A   Social History Main Topics  . Smoking status: Never Smoker   . Smokeless tobacco: None  . Alcohol Use: 3.0 oz/week    5 Glasses of wine per week  . Drug Use: No  . Sexual Activity: Not Asked   Other Topics Concern  . None   Social History Narrative   Married 26 years in 2015. No kids.       Retired from [employer], Writer  handler/union rep      Hobbies: yardwork, music, movies, reading   Objective: BP 122/70 mmHg  Pulse 70  Temp(Src) 98.8 F (37.1 C)  Ht 5\' 11"  (1.803 m)  Wt 186 lb (84.369 kg)  BMI 25.95 kg/m2 Gen: NAD, resting comfortably HEENT: Mucous membranes are moist. Oropharynx normal Neck: no thyromegaly CV: RRR no murmurs rubs or gallops Lungs: CTAB no crackles, wheeze, rhonchi Abdomen: soft/nontender/nondistended/normal bowel sounds. No rebound or guarding.  Rectal: normal tone, normal prostate, no masses or tenderness Ext: no edema Skin: warm, dry, largely stable lesion in axilla actually 4.5 x 6 mm raised brown lesion.  Neuro: grossly normal, moves all extremities, PERRLA  Assessment/Plan:  65 y.o. male presenting for  annual physical.  Health Maintenance counseling: 1. Anticipatory guidance: Patient counseled regarding regular dental exams, eye exams, wearing seatbelts.  2. Risk factor reduction:  Advised patient of need for regular exercise and diet rich and fruits and vegetables to reduce risk of heart attack and stroke.  3. Immunizations/screenings/ancillary studies Immunization History  Administered Date(s) Administered  . Influenza Split 02/27/2011, 02/09/2012  . Influenza Whole 01/26/2009, 02/03/2010  . Influenza,inj,Quad PF,36+ Mos 01/31/2013, 12/29/2013, 12/28/2014  . Pneumococcal Conjugate-13 06/30/2015  . Pneumococcal Polysaccharide-23 12/29/2013  . Td 06/29/2005  . Zoster 11/27/2011   Health Maintenance Due  Topic Date Due  . Hepatitis C Screening declines screening Dec 23, 1950  . PNA vac Low Risk Adult (2 of 2 - PCV13)- done today 12/30/2014  . TETANUS/TDAP insurance does not cover 06/30/2015    4. Prostate cancer screening- low risk based off PSA and rectal   Lab Results  Component Value Date   PSA 0.55 06/23/2015   PSA 0.62 12/29/2013   PSA 0.56 11/20/2011   5. Colon cancer screening - 10/05/2010 with 5 year repeat 6. Skin cancer screening- waist up exam today- stable on exam  Hypothyroidism- controlled on synthroid 217mcg Lab Results  Component Value Date   TSH 0.58 06/23/2015   Hyperlipidemia- controlled on atorvastatin 40mg  Lab Results  Component Value Date   CHOL 136 06/23/2015   HDL 50.30 06/23/2015   LDLCALC 70 06/23/2015   TRIG 78.0 06/23/2015   CHOLHDL 3 06/23/2015   Hyperglycemia- a1c not at increased risk but fasting CBG is- monitor.  Lab Results  Component Value Date   HGBA1C 5.5 06/23/2015   Kidney stone- has 2 remaining. Declines urology follow up. Passed 3 mm stone. Has percocet if one begins to move.   Return in about 6 months (around 12/31/2015) for thyroid and skin check. or 1 year CPE. Return precautions advised.   Orders Placed This Encounter    Procedures  . Pneumococcal conjugate vaccine 13-valent

## 2015-06-30 NOTE — Patient Instructions (Addendum)
Final pneumonia shot received today. [id number])  Sorry to hear about the kidney stone. I would try to drink at least 80 oz of water a day if not more. Lemon juice could help with prevention as well in your water.   Everything else looks pretty good with exception being at increased risk for diabetes on 1 of 2 tests. Continue efforts to eat well and exercise regularly.  Wt Readings from Last 3 Encounters:  06/30/15 186 lb (84.369 kg)  12/28/14 188 lb (85.276 kg)  06/29/14 190 lb (86.183 kg)  Pleased with your weight progress

## 2015-08-10 ENCOUNTER — Encounter: Payer: Self-pay | Admitting: Gastroenterology

## 2015-09-17 ENCOUNTER — Encounter: Payer: Self-pay | Admitting: Gastroenterology

## 2015-10-04 ENCOUNTER — Other Ambulatory Visit: Payer: Self-pay | Admitting: Family Medicine

## 2015-11-08 ENCOUNTER — Encounter: Payer: Self-pay | Admitting: Gastroenterology

## 2015-11-08 ENCOUNTER — Ambulatory Visit (AMBULATORY_SURGERY_CENTER): Payer: Self-pay | Admitting: *Deleted

## 2015-11-08 VITALS — Ht 71.0 in | Wt 187.2 lb

## 2015-11-08 DIAGNOSIS — Z86010 Personal history of colonic polyps: Secondary | ICD-10-CM

## 2015-11-08 MED ORDER — NA SULFATE-K SULFATE-MG SULF 17.5-3.13-1.6 GM/177ML PO SOLN: ORAL | Status: DC

## 2015-11-08 NOTE — Progress Notes (Signed)
No egg or soy allergy  No anesthesia or intubation problems per pt  No diet medications taken  No home oxygen used   

## 2015-11-22 ENCOUNTER — Ambulatory Visit (AMBULATORY_SURGERY_CENTER): Payer: Medicare Other | Admitting: Gastroenterology

## 2015-11-22 ENCOUNTER — Encounter: Payer: Self-pay | Admitting: Gastroenterology

## 2015-11-22 VITALS — BP 124/74 | HR 52 | Temp 97.3°F | Resp 21 | Ht 71.0 in | Wt 187.0 lb

## 2015-11-22 DIAGNOSIS — D123 Benign neoplasm of transverse colon: Secondary | ICD-10-CM | POA: Diagnosis not present

## 2015-11-22 DIAGNOSIS — Z86010 Personal history of colonic polyps: Secondary | ICD-10-CM

## 2015-11-22 NOTE — Patient Instructions (Signed)
YOU HAD AN ENDOSCOPIC PROCEDURE TODAY AT [facility]:   Refer to the procedure report that was given to you for any specific questions about what was found during the examination.  If the procedure report does not answer your questions, please call your gastroenterologist to clarify.  If you requested that your care partner not be given the details of your procedure findings, then the procedure report has been included in a sealed envelope for you to review at your convenience later.  YOU SHOULD EXPECT: Some feelings of bloating in the abdomen. Passage of more gas than usual.  Walking can help get rid of the air that was put into your GI tract during the procedure and reduce the bloating. If you had a lower endoscopy (such as a colonoscopy or flexible sigmoidoscopy) you may notice spotting of blood in your stool or on the toilet paper. If you underwent a bowel prep for your procedure, you may not have a normal bowel movement for a few days.  Please Note:  You might notice some irritation and congestion in your nose or some drainage.  This is from the oxygen used during your procedure.  There is no need for concern and it should clear up in a day or so.  SYMPTOMS TO REPORT IMMEDIATELY:   Following lower endoscopy (colonoscopy or flexible sigmoidoscopy):  Excessive amounts of blood in the stool  Significant tenderness or worsening of abdominal pains  Swelling of the abdomen that is new, acute  Fever of 100F or higher   For urgent or emergent issues, a gastroenterologist can be reached at any hour by calling [phone].   DIET: Your first meal following the procedure should be a small meal and then it is ok to progress to your normal diet. Heavy or fried foods are harder to digest and may make you feel nauseous or bloated.  Likewise, meals heavy in dairy and vegetables can increase bloating.  Drink plenty of fluids but you should avoid alcoholic beverages for 24 hours. Try to  increase the fiber in your diet, and drink plenty of water.  ACTIVITY:  You should plan to take it easy for the rest of today and you should NOT DRIVE or use heavy machinery until tomorrow (because of the sedation medicines used during the test).    FOLLOW UP: Our staff will call the number listed on your records the next business day following your procedure to check on you and address any questions or concerns that you may have regarding the information given to you following your procedure. If we do not reach you, we will leave a message.  However, if you are feeling well and you are not experiencing any problems, there is no need to return our call.  We will assume that you have returned to your regular daily activities without incident.  If any biopsies were taken you will be contacted by phone or by letter within the next 1-3 weeks.  Please call us at [phone] if you have not heard about the biopsies in 3 weeks.    SIGNATURES/CONFIDENTIALITY: You and/or your care partner have signed paperwork which will be entered into your electronic medical record.  These signatures attest to the fact that that the information above on your After Visit Summary has been reviewed and is understood.  Full responsibility of the confidentiality of this discharge information lies with you and/or your care-partner.  Thank-you for choosing [facility] for your healthcare needs today.

## 2015-11-22 NOTE — Progress Notes (Signed)
Called to room to assist during endoscopic procedure.  Patient ID and intended procedure confirmed with present staff. Received instructions for my participation in the procedure from the performing physician.  

## 2015-11-22 NOTE — Progress Notes (Signed)
Report to PACU, RN, vss, BBS= Clear.  

## 2015-11-22 NOTE — Op Note (Signed)
[place] Patient Name: Carl Lopez Procedure Date: 11/22/2015 9:56 AM MRN: [id number] Endoscopist: Ladene Artist , MD Age: 65 Referring MD:  Date of Birth: 06/25/1950 Gender:  Account #: [id number] Procedure:                Colonoscopy Indications:              Surveillance: Personal history of adenomatous                            polyps on last colonoscopy > 5 years ago Medicines:                Monitored Anesthesia Care Procedure:                Pre-Anesthesia Assessment:                           - Prior to the procedure, a History and Physical                            was performed, and patient medications and                            allergies were reviewed. The patient's tolerance of                            previous anesthesia was also reviewed. The risks                            and benefits of the procedure and the sedation                            options and risks were discussed with the patient.                            All questions were answered, and informed consent                            was obtained. Prior Anticoagulants: The patient has                            taken no previous anticoagulant or antiplatelet                            agents. ASA Grade Assessment: II - A patient with                            mild systemic disease. After reviewing the risks                            and benefits, the patient was deemed in                            satisfactory condition to undergo the procedure.  After obtaining informed consent, the colonoscope                            was passed under direct vision. Throughout the                            procedure, the patient's blood pressure, pulse, and                            oxygen saturations were monitored continuously. The                            Model PCF-[id number]) scope was introduced                            through the anus and  advanced to the the cecum,                            identified by appendiceal orifice and ileocecal                            valve. The ileocecal valve, appendiceal orifice,                            and rectum were photographed. The quality of the                            bowel preparation was good. The colonoscopy was                            performed without difficulty. The patient tolerated                            the procedure well. Scope In: 9:59:31 AM Scope Out: 10:14:20 AM Scope Withdrawal Time: 0 hours 11 minutes 18 seconds  Total Procedure Duration: 0 hours 14 minutes 49 seconds  Findings:                 A 8 mm polyp was found in the transverse colon. The                            polyp was sessile. The polyp was removed with a                            cold snare. Resection and retrieval were complete.                           Scattered small-mouthed diverticula were found in                            the sigmoid colon, descending colon, transverse                            colon and ascending colon. There was no  evidence of                            diverticular bleeding.                           The exam was otherwise without abnormality on                            direct and retroflexion views. Complications:            No immediate complications. Estimated blood loss:                            None. Estimated Blood Loss:     Estimated blood loss: none. Impression:               - One 8 mm polyp in the transverse colon, removed                            with a cold snare. Resected and retrieved.                           - Mild diverticulosis in the sigmoid colon, in the                            descending colon, in the transverse colon and in                            the ascending colon. There was no evidence of                            diverticular bleeding.                           - The examination was otherwise normal on direct                             and retroflexion views. Recommendation:           - Repeat colonoscopy in 5 years for surveillance.                           - Patient has a contact number available for                            emergencies. The signs and symptoms of potential                            delayed complications were discussed with the                            patient. Return to normal activities tomorrow.                            Written discharge instructions were provided to the  patient.                           - High fiber diet.                           - Continue present medications.                           - Await pathology results. Ladene Artist, MD 11/22/2015 10:17:14 AM This report has been signed electronically.

## 2015-11-23 ENCOUNTER — Telehealth: Payer: Self-pay

## 2015-11-23 NOTE — Telephone Encounter (Signed)
  Follow up Call-  Call back number 11/22/2015  Post procedure Call Back phone  # [phone]  Permission to leave phone message Yes  Some recent data might be hidden     Patient questions:  Do you have a fever, pain , or abdominal swelling? No. Pain Score  0 *  Have you tolerated food without any problems? Yes.    Have you been able to return to your normal activities? Yes.    Do you have any questions about your discharge instructions: Diet   No. Medications  No. Follow up visit  No.  Do you have questions or concerns about your Care? No.  Actions: * If pain score is 4 or above: No action needed, pain <4.

## 2015-12-01 ENCOUNTER — Encounter: Payer: Self-pay | Admitting: Gastroenterology

## 2016-02-09 ENCOUNTER — Ambulatory Visit (INDEPENDENT_AMBULATORY_CARE_PROVIDER_SITE_OTHER): Payer: Medicare Other | Admitting: Family Medicine

## 2016-02-09 DIAGNOSIS — Z23 Encounter for immunization: Secondary | ICD-10-CM | POA: Diagnosis not present

## 2016-02-10 ENCOUNTER — Ambulatory Visit: Payer: Medicare Other | Admitting: Family Medicine

## 2016-02-21 ENCOUNTER — Other Ambulatory Visit: Payer: Self-pay | Admitting: Family Medicine

## 2016-06-23 ENCOUNTER — Other Ambulatory Visit (INDEPENDENT_AMBULATORY_CARE_PROVIDER_SITE_OTHER): Payer: Medicare Other

## 2016-06-23 DIAGNOSIS — Z125 Encounter for screening for malignant neoplasm of prostate: Secondary | ICD-10-CM | POA: Diagnosis not present

## 2016-06-23 DIAGNOSIS — E039 Hypothyroidism, unspecified: Secondary | ICD-10-CM

## 2016-06-23 DIAGNOSIS — E785 Hyperlipidemia, unspecified: Secondary | ICD-10-CM

## 2016-06-23 DIAGNOSIS — R7309 Other abnormal glucose: Secondary | ICD-10-CM | POA: Diagnosis not present

## 2016-06-23 LAB — HEPATIC FUNCTION PANEL
ALK PHOS: 78 U/L (ref 39–117)
ALT: 11 U/L (ref 0–53)
AST: 12 U/L (ref 0–37)
Albumin: 4 g/dL (ref 3.5–5.2)
BILIRUBIN DIRECT: 0.2 mg/dL (ref 0.0–0.3)
BILIRUBIN TOTAL: 1 mg/dL (ref 0.2–1.2)
Total Protein: 6.1 g/dL (ref 6.0–8.3)

## 2016-06-23 LAB — CBC WITH DIFFERENTIAL/PLATELET
BASOS ABS: 0 10*3/uL (ref 0.0–0.1)
Basophils Relative: 0.6 % (ref 0.0–3.0)
EOS ABS: 0.1 10*3/uL (ref 0.0–0.7)
Eosinophils Relative: 1.4 % (ref 0.0–5.0)
HCT: 42.4 % (ref 39.0–52.0)
HEMOGLOBIN: 14.7 g/dL (ref 13.0–17.0)
LYMPHS ABS: 1.3 10*3/uL (ref 0.7–4.0)
Lymphocytes Relative: 24.8 % (ref 12.0–46.0)
MCHC: 34.6 g/dL (ref 30.0–36.0)
MCV: 92.6 fl (ref 78.0–100.0)
MONO ABS: 0.4 10*3/uL (ref 0.1–1.0)
Monocytes Relative: 7.2 % (ref 3.0–12.0)
NEUTROS PCT: 66 % (ref 43.0–77.0)
Neutro Abs: 3.5 10*3/uL (ref 1.4–7.7)
Platelets: 237 10*3/uL (ref 150.0–400.0)
RBC: 4.57 Mil/uL (ref 4.22–5.81)
RDW: 13.6 % (ref 11.5–15.5)
WBC: 5.2 10*3/uL (ref 4.0–10.5)

## 2016-06-23 LAB — POC URINALSYSI DIPSTICK (AUTOMATED)
GLUCOSE UA: NEGATIVE
Leukocytes, UA: NEGATIVE
NITRITE UA: NEGATIVE
RBC UA: NEGATIVE
Spec Grav, UA: >=1.03
Urobilinogen, UA: 1
pH, UA: 5.5

## 2016-06-23 LAB — PSA: PSA: 0.79 ng/mL (ref 0.10–4.00)

## 2016-06-23 LAB — BASIC METABOLIC PANEL
BUN: 18 mg/dL (ref 6–23)
CALCIUM: 9 mg/dL (ref 8.4–10.5)
CO2: 29 mEq/L (ref 19–32)
CREATININE: 0.99 mg/dL (ref 0.40–1.50)
Chloride: 107 mEq/L (ref 96–112)
GFR: 79.93 mL/min (ref >60.00)
GLUCOSE: 107 mg/dL — AB (ref 70–99)
POTASSIUM: 3.6 meq/L (ref 3.5–5.1)
Sodium: 144 mEq/L (ref 135–145)

## 2016-06-23 LAB — LIPID PANEL
CHOL/HDL RATIO: 3
CHOLESTEROL: 154 mg/dL (ref 0–200)
HDL: 49.8 mg/dL (ref >39.00)
LDL CALC: 81 mg/dL (ref 0–99)
NonHDL: 104.37
Triglycerides: 115 mg/dL (ref 0.0–149.0)
VLDL: 23 mg/dL (ref 0.0–40.0)

## 2016-06-23 LAB — TSH: TSH: 0.33 u[IU]/mL — AB (ref 0.35–4.50)

## 2016-06-23 LAB — HEMOGLOBIN A1C: Hgb A1c MFr Bld: 5.5 % (ref 4.6–6.5)

## 2016-06-30 ENCOUNTER — Other Ambulatory Visit: Payer: Self-pay

## 2016-06-30 ENCOUNTER — Encounter: Payer: Self-pay | Admitting: Family Medicine

## 2016-06-30 ENCOUNTER — Ambulatory Visit (INDEPENDENT_AMBULATORY_CARE_PROVIDER_SITE_OTHER): Payer: Medicare Other | Admitting: Family Medicine

## 2016-06-30 VITALS — BP 116/76 | HR 88 | Temp 98.5°F | Ht 69.5 in | Wt 187.4 lb

## 2016-06-30 DIAGNOSIS — Z0000 Encounter for general adult medical examination without abnormal findings: Secondary | ICD-10-CM

## 2016-06-30 MED ORDER — ATORVASTATIN CALCIUM 40 MG PO TABS: 40.0000 mg | ORAL_TABLET | Freq: Every day | ORAL | 3 refills | Status: DC

## 2016-06-30 MED ORDER — LEVOTHYROXINE SODIUM 200 MCG PO TABS: ORAL_TABLET | ORAL | 1 refills | Status: DC

## 2016-06-30 NOTE — Progress Notes (Signed)
Phone: [phone]  Subjective:  Patient presents today for their annual physical. Chief complaint-noted.   See problem oriented charting- ROS- full  review of systems was completed and negative including No chest pain or shortness of breath. No headache or blurry vision.   The following were reviewed and entered/updated in epic: Past Medical History:  Diagnosis Date  . BACK PAIN 01/26/2009   1987    . Chronic kidney disease    kidney stone 2017  . DIVERTICULOSIS, COLON 07/29/2007  . Hyperlipidemia   . Thyroid disease    66years old, meds 2-3 years   Patient Active Problem List   Diagnosis Date Noted  . Hypothyroidism 07/29/2007    Priority: Medium  . Hyperlipidemia 07/29/2007    Priority: Medium  . Skin lesion 06/29/2014    Priority: Low  . Hyperglycemia 06/29/2014    Priority: Low  . Abdominal mass 01/12/2014    Priority: Low  . SNORING 08/03/2008    Priority: Low  . COLONIC POLYPS, HX OF 07/29/2007    Priority: Low  . Nephrolithiasis 06/30/2015   Past Surgical History:  Procedure Laterality Date  . CHOLECYSTECTOMY  1988  . COLONOSCOPY    . [place]  . INGUINAL HERNIA REPAIR  05-31-10  . POLYPECTOMY      Family History  Problem Relation Age of Onset  . Colon cancer Maternal Uncle   . Colon cancer Other   . Breast cancer Mother     85  . Dementia Father     73  . CVA Father   . Esophageal cancer Neg Hx   . Rectal cancer Neg Hx   . Stomach cancer Neg Hx     Medications- reviewed and updated Current Outpatient Prescriptions  Medication Sig Dispense Refill  . aspirin 81 MG tablet Take 81 mg by mouth daily.      Marland Kitchen atorvastatin (LIPITOR) 40 MG tablet Take 1 tablet by mouth  daily 90 tablet 3  . Multiple Vitamins-Minerals (PRESERVISION AREDS 2 PO) Take 1 tablet by mouth daily.    Marland Kitchen SYNTHROID 200 MCG tablet TAKE 1 TABLET BY MOUTH  DAILY BEFORE BREAKFAST 90 tablet 1   No current facility-administered medications for this visit.      Allergies-reviewed and updated Allergies  Allergen Reactions  . Penicillins Swelling and Other (See Comments)    Unknown Has patient had a PCN reaction causing immediate rash, facial/tongue/throat swelling, SOB or lightheadedness with hypotension:  unknown  Has patient had a PCN reaction causing severe rash involving mucus membranes or skin necrosis: unknown Has patient had a PCN reaction that required hospitalization : unknown,  Has patient had a PCN reaction occurring within the last 10 years: No Childhood allergy, swelling of arm after injection of penicillin    Social History   Social History  . Marital status: Married    Spouse name: N/A  . Number of children: N/A  . Years of education: N/A   Social History Main Topics  . Smoking status: Never Smoker  . Smokeless tobacco: Never Used  . Alcohol use 3.0 oz/week    5 Glasses of wine per week  . Drug use: No  . Sexual activity: Not Asked   Other Topics Concern  . None   Social History Narrative   Married 26 years in 2015. No kids.       Retired from [employer], Electrical engineer rep      Hobbies: yardwork, music, movies, reading    Objective:  BP 116/76 (BP Location: Left Arm, Patient Position: Sitting, Cuff Size: Large)   Pulse 88   Temp 98.5 F (36.9 C) (Oral)   Ht 5' 9.5" (1.765 m)   Wt 187 lb 6.4 oz (85 kg)   SpO2 96%   BMI 27.28 kg/m  Gen: NAD, resting comfortably HEENT: Mucous membranes are moist. Oropharynx normal Neck: no thyromegaly CV: RRR no murmurs rubs or gallops Lungs: CTAB no crackles, wheeze, rhonchi Abdomen: soft/nontender/nondistended/normal bowel sounds. No rebound or guarding.  Ext: no edema Skin: warm, dry Neuro: grossly normal, moves all extremities, PERRLA Rectal: normal tone, mild diffusely enlarged prostate, no masses or tenderness   Assessment/Plan:  66 y.o. male presenting for annual physical.  Health Maintenance counseling: 1. Anticipatory guidance: Patient  counseled regarding regular dental exams q6 months, eye exams - goes next month, wearing seatbelts.  2. Risk factor reduction:  Advised patient of need for regular exercise and diet rich and fruits and vegetables to reduce risk of heart attack and stroke. Exercise- has been low in the winter, hoping to pick up in the spring. Diet- reasonable, weight stable. Would like to trim about 10 lbs. Could drink more water, needs winter exercise. More veggies needed.  Wt Readings from Last 3 Encounters:  06/30/16 187 lb 6.4 oz (85 kg)  11/22/15 187 lb (84.8 kg)  11/08/15 187 lb 3.2 oz (84.9 kg)  3. Immunizations/screenings/ancillary studies- up to date Immunization History  Administered Date(s) Administered  . Influenza Split 02/27/2011, 02/09/2012  . Influenza Whole 01/26/2009, 02/03/2010  . Influenza, High Dose Seasonal PF 02/09/2016  . Influenza,inj,Quad PF,36+ Mos 01/31/2013, 12/29/2013, 12/28/2014  . Pneumococcal Conjugate-13 06/30/2015  . Pneumococcal Polysaccharide-23 12/29/2013  . Td 06/29/2005  . Zoster 11/27/2011   Health Maintenance Due  Topic Date Due  . URINE MICROALBUMIN - not diabetic 08/15/1958  . TETANUS/TDAP - will come in for Td if gets cut/scrape 06/30/2015   4. Prostate cancer screening- low risk psa trend and rectal exam. Mild BPH.   Lab Results  Component Value Date   PSA 0.79 06/23/2016   PSA 0.55 06/23/2015   PSA 0.62 12/29/2013   5. Colon cancer screening - 11/22/15 with 5 year repeat 6. Skin cancer screening-  we completed full exam today and no worrisome lesions- several seborrheic keratosis. One mole with darker pigmentation raised uner left arm but stable for years.   Status of chronic or acute concerns  Hypothyroidism- very slightly over controlled on synthroid 267mcg. Close enough that we will monitor as asymptomatic. Felt very tired and had weight gain on the 175 mcg dose.  Lab Results  Component Value Date   TSH 0.33 (L) 06/23/2016   HLD- controlled on  atorvastatin 40mg  with reasonable LDL at 81  Hyperglycemia- fasting sugar elevated at 107 but down from 110 last year. a1c not elevated. Continue to trend yearly Lab Results  Component Value Date   HGBA1C 5.5 06/23/2016   Nephrolithiasis- hx of stones.   Return precautions advised.  Garret Reddish, MD

## 2016-06-30 NOTE — Progress Notes (Signed)
Pre visit review using our clinic review tool, if applicable. No additional management support is needed unless otherwise documented below in the visit note. 

## 2016-06-30 NOTE — Patient Instructions (Signed)
I like your idea of working to get closer to 175. Increasing water and veggie intake and cutting down diet soda could help.   Thyroid just a hair off- opted to continue current dose and continue until next years check. Would advise you to contact us if you have unintentional weight loss, diarrhea, sweatiness, feeling warm all the time as these could be signs of a truly overactive thyroid

## 2016-08-11 ENCOUNTER — Other Ambulatory Visit: Payer: Self-pay | Admitting: Family Medicine

## 2016-11-14 ENCOUNTER — Other Ambulatory Visit: Payer: Self-pay | Admitting: Family Medicine

## 2017-01-31 ENCOUNTER — Ambulatory Visit (INDEPENDENT_AMBULATORY_CARE_PROVIDER_SITE_OTHER): Payer: Medicare Other

## 2017-01-31 DIAGNOSIS — Z23 Encounter for immunization: Secondary | ICD-10-CM

## 2017-02-15 DIAGNOSIS — H5203 Hypermetropia, bilateral: Secondary | ICD-10-CM | POA: Diagnosis not present

## 2017-02-15 DIAGNOSIS — H524 Presbyopia: Secondary | ICD-10-CM | POA: Diagnosis not present

## 2017-02-15 DIAGNOSIS — H52203 Unspecified astigmatism, bilateral: Secondary | ICD-10-CM | POA: Diagnosis not present

## 2017-05-24 ENCOUNTER — Telehealth: Payer: Self-pay | Admitting: Family Medicine

## 2017-05-24 NOTE — Telephone Encounter (Signed)
Copied from [place] [id number]. Topic: Quick Communication - Other Results >> May 24, 2017 12:16 PM Robina Ade, Helene Kelp D wrote: Patient would like his lab done before his appt. He would like to know if he can come in and have them done a week before. His CPE appt is 08/13/17 with Dr. Yong Channel, please call patient back, thanks.

## 2017-05-24 NOTE — Telephone Encounter (Signed)
See note

## 2017-05-24 NOTE — Telephone Encounter (Signed)
You may order labs for him- same labs from 07/10/2016 under same diagnosis codes listed on those labs.   Please inform patient that we have had more issues with previsit labs then same day as physical or after physical labs.

## 2017-05-25 ENCOUNTER — Other Ambulatory Visit: Payer: Self-pay

## 2017-05-25 DIAGNOSIS — E039 Hypothyroidism, unspecified: Secondary | ICD-10-CM

## 2017-05-25 DIAGNOSIS — E785 Hyperlipidemia, unspecified: Secondary | ICD-10-CM

## 2017-05-25 DIAGNOSIS — Z125 Encounter for screening for malignant neoplasm of prostate: Secondary | ICD-10-CM

## 2017-05-25 DIAGNOSIS — R7309 Other abnormal glucose: Secondary | ICD-10-CM

## 2017-05-25 NOTE — Telephone Encounter (Signed)
Called and left a voicemail message asking patient to return my call. I did enter in the lab orders for him. He needs to be scheduled for a lab appointment

## 2017-05-28 NOTE — Telephone Encounter (Signed)
Patient is scheduled for a lab appointment on 08/06/17

## 2017-05-31 ENCOUNTER — Other Ambulatory Visit: Payer: Self-pay | Admitting: Family Medicine

## 2017-08-06 ENCOUNTER — Other Ambulatory Visit (INDEPENDENT_AMBULATORY_CARE_PROVIDER_SITE_OTHER): Payer: Medicare Other

## 2017-08-06 DIAGNOSIS — Z125 Encounter for screening for malignant neoplasm of prostate: Secondary | ICD-10-CM | POA: Diagnosis not present

## 2017-08-06 DIAGNOSIS — E785 Hyperlipidemia, unspecified: Secondary | ICD-10-CM | POA: Diagnosis not present

## 2017-08-06 DIAGNOSIS — E039 Hypothyroidism, unspecified: Secondary | ICD-10-CM

## 2017-08-06 DIAGNOSIS — R7309 Other abnormal glucose: Secondary | ICD-10-CM

## 2017-08-06 LAB — LIPID PANEL
CHOLESTEROL: 145 mg/dL (ref 0–200)
HDL: 47.2 mg/dL (ref >39.00)
LDL Cholesterol: 68 mg/dL (ref 0–99)
NONHDL: 97.83
TRIGLYCERIDES: 151 mg/dL — AB (ref 0.0–149.0)
Total CHOL/HDL Ratio: 3
VLDL: 30.2 mg/dL (ref 0.0–40.0)

## 2017-08-06 LAB — BASIC METABOLIC PANEL
BUN: 19 mg/dL (ref 6–23)
CALCIUM: 9.1 mg/dL (ref 8.4–10.5)
CHLORIDE: 102 meq/L (ref 96–112)
CO2: 29 mEq/L (ref 19–32)
CREATININE: 0.89 mg/dL (ref 0.40–1.50)
GFR: 90.09 mL/min (ref >60.00)
Glucose, Bld: 107 mg/dL — ABNORMAL HIGH (ref 70–99)
Potassium: 3.8 mEq/L (ref 3.5–5.1)
SODIUM: 140 meq/L (ref 135–145)

## 2017-08-06 LAB — CBC
HEMATOCRIT: 43.6 % (ref 39.0–52.0)
HEMOGLOBIN: 15.2 g/dL (ref 13.0–17.0)
MCHC: 34.8 g/dL (ref 30.0–36.0)
MCV: 93.7 fl (ref 78.0–100.0)
Platelets: 230 10*3/uL (ref 150.0–400.0)
RBC: 4.66 Mil/uL (ref 4.22–5.81)
RDW: 13.3 % (ref 11.5–15.5)
WBC: 5.9 10*3/uL (ref 4.0–10.5)

## 2017-08-06 LAB — HEPATIC FUNCTION PANEL
ALBUMIN: 4 g/dL (ref 3.5–5.2)
ALK PHOS: 89 U/L (ref 39–117)
ALT: 12 U/L (ref 0–53)
AST: 13 U/L (ref 0–37)
Bilirubin, Direct: 0.2 mg/dL (ref 0.0–0.3)
Total Bilirubin: 1.1 mg/dL (ref 0.2–1.2)
Total Protein: 6.5 g/dL (ref 6.0–8.3)

## 2017-08-06 LAB — HEMOGLOBIN A1C: Hgb A1c MFr Bld: 5.5 % (ref 4.6–6.5)

## 2017-08-06 LAB — PSA: PSA: 6.05 ng/mL — AB (ref 0.10–4.00)

## 2017-08-06 LAB — TSH: TSH: 0.25 u[IU]/mL — AB (ref 0.35–4.50)

## 2017-08-13 ENCOUNTER — Ambulatory Visit (INDEPENDENT_AMBULATORY_CARE_PROVIDER_SITE_OTHER): Payer: Medicare Other | Admitting: Family Medicine

## 2017-08-13 ENCOUNTER — Encounter: Payer: Self-pay | Admitting: Family Medicine

## 2017-08-13 VITALS — BP 140/86 | HR 72 | Temp 98.4°F | Ht 69.5 in | Wt 189.2 lb

## 2017-08-13 DIAGNOSIS — Z0000 Encounter for general adult medical examination without abnormal findings: Secondary | ICD-10-CM

## 2017-08-13 DIAGNOSIS — E039 Hypothyroidism, unspecified: Secondary | ICD-10-CM

## 2017-08-13 DIAGNOSIS — S61210A Laceration without foreign body of right index finger without damage to nail, initial encounter: Secondary | ICD-10-CM

## 2017-08-13 DIAGNOSIS — Z23 Encounter for immunization: Secondary | ICD-10-CM | POA: Diagnosis not present

## 2017-08-13 DIAGNOSIS — R739 Hyperglycemia, unspecified: Secondary | ICD-10-CM | POA: Diagnosis not present

## 2017-08-13 DIAGNOSIS — R972 Elevated prostate specific antigen [PSA]: Secondary | ICD-10-CM

## 2017-08-13 DIAGNOSIS — Z86010 Personal history of colonic polyps: Secondary | ICD-10-CM | POA: Diagnosis not present

## 2017-08-13 DIAGNOSIS — R1900 Intra-abdominal and pelvic swelling, mass and lump, unspecified site: Secondary | ICD-10-CM | POA: Diagnosis not present

## 2017-08-13 DIAGNOSIS — E785 Hyperlipidemia, unspecified: Secondary | ICD-10-CM | POA: Diagnosis not present

## 2017-08-13 MED ORDER — LEVOTHYROXINE SODIUM 88 MCG PO TABS: 176.0000 ug | ORAL_TABLET | Freq: Every day | ORAL | 3 refills | Status: DC

## 2017-08-13 NOTE — Assessment & Plan Note (Signed)
Abdominal mass- possible foreign body related to prior surgery. We are watching by clnical exam.

## 2017-08-13 NOTE — Addendum Note (Signed)
Addended by: Lucianne Lei M on: 08/13/2017 10:43 AM   Modules accepted: Orders

## 2017-08-13 NOTE — Assessment & Plan Note (Signed)
Hyperglycemia- outside of risk for diabetes per a1c though CBG still in at risk range Lab Results  Component Value Date   HGBA1C 5.5 08/06/2017

## 2017-08-13 NOTE — Assessment & Plan Note (Signed)
Hypothyroidism- remains slightly overtreated- will reduce dose today to 176 mcg and repeat in 6 weeks Lab Results  Component Value Date   TSH 0.25 (L) 08/06/2017

## 2017-08-13 NOTE — Patient Instructions (Addendum)
Giving Td/tetanus shot today due to finger laceration  discussed getting shingrix at pharmacy and then updating [place]. Otherwise immunizations up to date.   Thyroid remains slightly overtreated- will reduce dose today to 176 mcg and repeat in 6 weeks- please schedule a lab visit before you leave  Please schedule a 30 minute procedure visit with me sometime over the next month or two. Actually 11 30 on may 2nd would be ok- [facility] is a "same day" slot but it backs up to lunch so it would give [place] more time if needed  We will call you within a week or two about your referral to urology due to your elevated PSA. If you do not hear within 3 weeks, give [place] a call.

## 2017-08-13 NOTE — Progress Notes (Signed)
Phone: [phone]  Subjective:  Patient presents today for their annual physical. Chief complaint-noted.   See problem oriented charting- ROS- full  review of systems was completed and negative except for: cut on right finger from lawnmower  The following were reviewed and entered/updated in epic: Past Medical History:  Diagnosis Date  . BACK PAIN 01/26/2009   1987    . Chronic kidney disease    kidney stone 2017  . DIVERTICULOSIS, COLON 07/29/2007  . Hyperlipidemia   . Thyroid disease    67years old, meds 2-3 years   Patient Active Problem List   Diagnosis Date Noted  . Hypothyroidism 07/29/2007    Priority: Medium  . Hyperlipidemia 07/29/2007    Priority: Medium  . Nephrolithiasis 06/30/2015    Priority: Low  . Skin lesion 06/29/2014    Priority: Low  . Hyperglycemia 06/29/2014    Priority: Low  . Abdominal mass 01/12/2014    Priority: Low  . SNORING 08/03/2008    Priority: Low  . COLONIC POLYPS, HX OF 07/29/2007    Priority: Low   Past Surgical History:  Procedure Laterality Date  . CHOLECYSTECTOMY  1988  . COLONOSCOPY    . [place]  . INGUINAL HERNIA REPAIR  05-31-10  . POLYPECTOMY      Family History  Problem Relation Age of Onset  . Colon cancer Maternal Uncle   . Colon cancer Other   . Breast cancer Mother        72  . Dementia Father        76  . CVA Father   . Esophageal cancer Neg Hx   . Rectal cancer Neg Hx   . Stomach cancer Neg Hx     Medications- reviewed and updated Current Outpatient Medications  Medication Sig Dispense Refill  . aspirin 81 MG tablet Take 81 mg by mouth daily.      Marland Kitchen atorvastatin (LIPITOR) 40 MG tablet TAKE 1 TABLET BY MOUTH  DAILY 90 tablet 1  . Multiple Vitamins-Minerals (PRESERVISION AREDS 2 PO) Take 1 tablet by mouth daily.    Marland Kitchen SYNTHROID 200 MCG tablet TAKE 1 TABLET BY MOUTH  DAILY BEFORE BREAKFAST 90 tablet 2   No current facility-administered medications for this visit.      Allergies-reviewed and updated Allergies  Allergen Reactions  . Penicillins Swelling and Other (See Comments)    Unknown Has patient had a PCN reaction causing immediate rash, facial/tongue/throat swelling, SOB or lightheadedness with hypotension:  unknown  Has patient had a PCN reaction causing severe rash involving mucus membranes or skin necrosis: unknown Has patient had a PCN reaction that required hospitalization : unknown,  Has patient had a PCN reaction occurring within the last 10 years: No Childhood allergy, swelling of arm after injection of penicillin    Social History   Social History Narrative   Married 26 years in 2015. No kids.       Retired from [employer], Electrical engineer rep      Hobbies: yardwork, music, movies, reading    Objective: BP 140/86 (BP Location: Left Arm, Patient Position: Sitting, Cuff Size: Large)   Pulse 72   Temp 98.4 F (36.9 C) (Oral)   Ht 5' 9.5" (1.765 m)   Wt 189 lb 3.2 oz (85.8 kg)   SpO2 99%   BMI 27.54 kg/m  Gen: NAD, resting comfortably HEENT: Mucous membranes are moist. Oropharynx normal Neck: no thyromegaly CV: RRR no murmurs rubs or gallops Lungs: CTAB no  crackles, wheeze, rhonchi Abdomen: soft/nontender/nondistended/normal bowel sounds. No rebound or guarding. Palpable small mass in epigastric area unchanged from previous Ext: no edema Skin: warm, dry, under left axilla there is a now 7x 8 mm raised brownish lesion- larger than last CPE Neuro: grossly normal, moves all extremities, PERRLA Rectal: normal tone, normal sized prostate, no masses or tenderness  Assessment/Plan:  67 y.o. male presenting for annual physical.  Health Maintenance counseling: 1. Anticipatory guidance: Patient counseled regarding regular dental exams q6 months, eye exams yearly, wearing seatbelts.  2. Risk factor reduction:  Advised patient of need for regular exercise and diet rich and fruits and vegetables to reduce risk of heart  attack and stroke. His goal last year was to trim about 10 lbs over time - he is up 2 lbs but starting to get more active as coming out of winter, doing a little more water. Sips on coke through day.  Wt Readings from Last 3 Encounters:  08/13/17 189 lb 3.2 oz (85.8 kg)  06/30/16 187 lb 6.4 oz (85 kg)  11/22/15 187 lb (84.8 kg)  3. Immunizations/screenings/ancillary studies- discussed getting shingrix aat pharmacy and then updating [place]. Otherwise immunizations up to date. Giving Td/tetanus shot today due to finger laceration. Otherwise immunizations up to date.  Immunization History  Administered Date(s) Administered  . Influenza Split 02/27/2011, 02/09/2012  . Influenza Whole 01/26/2009, 02/03/2010  . Influenza, High Dose Seasonal PF 02/09/2016, 01/31/2017  . Influenza,inj,Quad PF,6+ Mos 01/31/2013, 12/29/2013, 12/28/2014  . Pneumococcal Conjugate-13 06/30/2015  . Pneumococcal Polysaccharide-23 12/29/2013  . Td 06/29/2005  . Zoster 11/27/2011  4. Prostate cancer screening-  given increase in PSA over last year will refer to urology.  Lab Results  Component Value Date   PSA 6.05 (H) 08/06/2017   PSA 0.79 06/23/2016   PSA 0.55 06/23/2015   5. Colon cancer screening - 11/22/15 with 5 year repeat due to adenoma 6. Skin cancer screening- no regular dermatologist. advised regular sunscreen use. Denies worrisome, changing, or new skin lesions.   Status of chronic or acute concerns   Under left arm - mole dark 5 x 5.5 raised brown to black previously- has enlarged- will have back for shave biopsy  Right index finger laceration- small cut on finger from cleaning lawnmower within the last week  Hyperglycemia Hyperglycemia- outside of risk for diabetes per a1c though CBG still in at risk range Lab Results  Component Value Date   HGBA1C 5.5 08/06/2017     Hypothyroidism Hypothyroidism- remains slightly overtreated- will reduce dose today to 176 mcg and repeat in 6 weeks Lab Results   Component Value Date   TSH 0.25 (L) 08/06/2017     Hyperlipidemia Hyperlipidemia- on atorvastatin 40mg  and LDL looks great at 68  Abdominal mass Abdominal mass- possible foreign body related to prior surgery. We are watching by clnical exam.    1 year physical. Also doing procedure only visit for lesion under arm.  Lab/Order associations: Hypothyroidism, unspecified type - Plan: TSH  Elevated PSA - Plan: Ambulatory referral to Urology  Laceration of right index finger without foreign body without damage to nail, initial encounter- Td  Meds ordered this encounter  Medications  . levothyroxine (SYNTHROID, LEVOTHROID) 88 MCG tablet    Sig: Take 2 tablets (176 mcg total) by mouth daily.    Dispense:  180 tablet    Refill:  3   Return precautions advised.  Garret Reddish, MD

## 2017-08-13 NOTE — Assessment & Plan Note (Signed)
Hyperlipidemia- on atorvastatin 40mg  and LDL looks great at 68

## 2017-08-30 ENCOUNTER — Encounter: Payer: Self-pay | Admitting: Family Medicine

## 2017-08-30 ENCOUNTER — Ambulatory Visit (INDEPENDENT_AMBULATORY_CARE_PROVIDER_SITE_OTHER): Payer: Medicare Other | Admitting: Family Medicine

## 2017-08-30 VITALS — BP 136/82 | HR 66 | Temp 98.3°F | Ht 69.5 in | Wt 186.8 lb

## 2017-08-30 DIAGNOSIS — L820 Inflamed seborrheic keratosis: Secondary | ICD-10-CM

## 2017-08-30 DIAGNOSIS — D485 Neoplasm of uncertain behavior of skin: Secondary | ICD-10-CM

## 2017-08-30 DIAGNOSIS — E039 Hypothyroidism, unspecified: Secondary | ICD-10-CM

## 2017-08-30 NOTE — Assessment & Plan Note (Addendum)
S: diagnosed with Graves' disease when he was 67 years of age. Synthroid 271mcg has done really well for him but recently went up to over $100 per fill- we changed at CPE to 88 mcg x 2 pills due to  Lab Results  Component Value Date   TSH 0.25 (L) 08/06/2017  A/P: he requests change to generic levothyroxine. I realized after we discussed that I already sent in 176 mcg for him to take after CPE. I sent him a mychart message for him to ask optum what the cost will be.   When he gets 6 weeks out from transition (has some left over synthroid)- he will call for a lab visit. I placed an order for TSH today.

## 2017-08-30 NOTE — Progress Notes (Signed)
Subjective:  Carl Lopez is a 67 y.o. year old very pleasant male patient who presents for biopsy of skin but has questions about his hypothyroidism. See problem oriented charting ROS- no redness around lesion. No unintentional weight loss or gain.    Past Medical History-  Patient Active Problem List   Diagnosis Date Noted  . Hypothyroidism 07/29/2007    Priority: Medium  . Hyperlipidemia 07/29/2007    Priority: Medium  . Nephrolithiasis 06/30/2015    Priority: Low  . Skin lesion 06/29/2014    Priority: Low  . Hyperglycemia 06/29/2014    Priority: Low  . Abdominal mass 01/12/2014    Priority: Low  . SNORING 08/03/2008    Priority: Low  . COLONIC POLYPS, HX OF 07/29/2007    Priority: Low    Medications- reviewed and updated Current Outpatient Medications  Medication Sig Dispense Refill  . aspirin 81 MG tablet Take 81 mg by mouth daily.      Marland Kitchen atorvastatin (LIPITOR) 40 MG tablet TAKE 1 TABLET BY MOUTH  DAILY 90 tablet 1  . levothyroxine (SYNTHROID, LEVOTHROID) 88 MCG tablet Take 2 tablets (176 mcg total) by mouth daily. 180 tablet 3  . Multiple Vitamins-Minerals (PRESERVISION AREDS 2 PO) Take 1 tablet by mouth daily.     No current facility-administered medications for this visit.     Objective: BP 136/82 (BP Location: Left Arm, Patient Position: Sitting, Cuff Size: Normal)   Pulse 66   Temp 98.3 F (36.8 C) (Oral)   Ht 5' 9.5" (1.765 m)   Wt 186 lb 12.8 oz (84.7 kg)   SpO2 98%   BMI 27.19 kg/m  Gen: NAD, resting comfortably CV: Normal rate Lungs: Nonlabored and normal respiratory rate Ext: no edema Skin: warm, dry 5 x 7 mm raised lesion with brownish discoloration in left axilla  Skin Biopsy Procedure Note   PRE-OP DIAGNOSIS: Neoplasm of uncertain behavior of skin  POST-OP DIAGNOSIS: Neoplasm of uncertain behavior of skin Size of lesion: 5 x7 mm Location of lesion left axilla PROCEDURE:  Shave Biopsy but able to remove full lesion  The area  surrounding the skin lesion was prepared and draped in the usual sterile manner. The lesion was removed in the usual manner by the biopsy method noted above. Hemostasis was assured. The patient tolerated the procedure well.  Closure: silver nitrate for hemostasis          Followup: The patient tolerated the procedure well without complications.  Standard post-procedure care is explained and return precautions are given.  Assessment/Plan:  Neoplasm of uncertain behavior of skin - Plan: Dermatology pathology S: from last visit "Under left arm - mole dark 5 x 5.5 raised brown to black previously- has enlarged- will have back for shave biopsy" has actually enlarged some more now up to 5 x 7 mm today A/P: Shave biopsy-we will send for Derm path  Hypothyroidism S: diagnosed with Graves' disease when he was 67 years of age. Synthroid 267mcg has done really well for him but recently went up to over $100 per fill- we changed at CPE to 88 mcg x 2 pills due to  Lab Results  Component Value Date   TSH 0.25 (L) 08/06/2017  A/P: he requests change to generic levothyroxine. I realized after we discussed that I already sent in 176 mcg for him to take after CPE. I sent him a mychart message for him to ask optum what the cost will be.   When he gets 6 weeks out  from transition (has some left over synthroid)- he will call for a lab visit. I placed an order for TSH today.    Future Appointments  Date Time Provider Leigh  09/25/2017  8:30 AM [facility]   Lab/Order associations: Neoplasm of uncertain behavior of skin - Plan: Dermatology pathology  Hypothyroidism, unspecified type - Plan: TSH  Return precautions advised.  Garret Reddish, MD

## 2017-08-30 NOTE — Patient Instructions (Addendum)
Thyroid change to generic levothyroxine. When he gets 6 weeks out from transition (has some left over synthroid)- he will call for a lab visit. I placed an order for TSH today.   PSA We have faxed referral to urology. Usually takes 2-3 months to get in but I want them to at least go ahead and give you a date. Please call # listed to follow up with them about referral. Send me a mychart message if you havent been able to get this scheduled within 2 weeks and we will have Lanny Hurst put some additional pressure from our side  Biopsy  You had a biopsy today- leave bandage in place for 24 hours. May then remove and wash with soapy water but not scrub. After bathing, reapply triple antibiotic ointment and bandaid.  If it bleeds- apply pressure up to 30 minutes to an hour- seek care if continues to bleed past that point. If you have expanding redness or worsening pain then let us reevaluate the spot.   We have ordered labs or studies at this visit (pathology of lesion). It can take up to 1-2 weeks for results and processing. IF results require follow up or explanation, we will call you with instructions. Clinically stable results will be released to your [facility]. If you have not heard from [place] or cannot find your results in [facility] in 2 weeks please contact our office at [phone].  If you are not yet signed up for [facility], please consider signing up

## 2017-08-31 ENCOUNTER — Encounter: Payer: Self-pay | Admitting: Family Medicine

## 2017-09-03 ENCOUNTER — Telehealth: Payer: Self-pay

## 2017-09-03 NOTE — Telephone Encounter (Signed)
Called patient and left a message to call office back.  

## 2017-09-10 DIAGNOSIS — R972 Elevated prostate specific antigen [PSA]: Secondary | ICD-10-CM | POA: Diagnosis not present

## 2017-09-10 LAB — PSA: PSA: 0.6

## 2017-09-12 ENCOUNTER — Encounter: Payer: Self-pay | Admitting: Family Medicine

## 2017-09-25 ENCOUNTER — Other Ambulatory Visit: Payer: Medicare Other

## 2017-10-25 ENCOUNTER — Telehealth: Payer: Self-pay | Admitting: Family Medicine

## 2017-10-25 ENCOUNTER — Other Ambulatory Visit: Payer: Self-pay

## 2017-10-25 MED ORDER — LEVOTHYROXINE SODIUM 88 MCG PO TABS: 176.0000 ug | ORAL_TABLET | Freq: Every day | ORAL | 3 refills | Status: DC

## 2017-10-25 NOTE — Telephone Encounter (Signed)
Medication filled on 10/25/17

## 2017-10-25 NOTE — Telephone Encounter (Signed)
Copied from [place] [id number]. Topic: Quick Communication - Rx Refill/Question >> Oct 25, 2017  9:19 AM Carl Lopez wrote: Medication: levothyroxine (SYNTHROID, LEVOTHROID) 88 MCG tablet  - [name] states no RX was received in April - she states they requested RX 6/12 & 6/17. Please advise. Preferred Pharmacy (with phone number or street name): [place], [place] [facility] [phone] (Phone) [phone] (Fax)  REF: [id number]

## 2017-11-28 ENCOUNTER — Ambulatory Visit (INDEPENDENT_AMBULATORY_CARE_PROVIDER_SITE_OTHER): Payer: Medicare Other

## 2017-11-28 ENCOUNTER — Other Ambulatory Visit (INDEPENDENT_AMBULATORY_CARE_PROVIDER_SITE_OTHER): Payer: Medicare Other

## 2017-11-28 VITALS — BP 126/88 | HR 59 | Temp 98.7°F | Ht 69.5 in | Wt 187.0 lb

## 2017-11-28 DIAGNOSIS — Z0000 Encounter for general adult medical examination without abnormal findings: Secondary | ICD-10-CM | POA: Diagnosis not present

## 2017-11-28 DIAGNOSIS — E039 Hypothyroidism, unspecified: Secondary | ICD-10-CM

## 2017-11-28 LAB — TSH: TSH: 0.09 u[IU]/mL — ABNORMAL LOW (ref 0.35–4.50)

## 2017-11-28 NOTE — Patient Instructions (Addendum)
Carl Lopez , Thank you for taking time to come for your Medicare Wellness Visit. I appreciate your ongoing commitment to your health goals. Please review the following plan we discussed and let me know if I can assist you in the future.   These are the goals we discussed: Goals    . DIET - INCREASE WATER INTAKE     Currently drinks 1-2 bottles a day of water. Would like to increase to 3-4 bottles a day.       This is a list of the screening recommended for you and due dates:  Health Maintenance  Topic Date Due  .  Hepatitis C: One time screening is recommended by Center for Disease Control  (CDC) for  adults born from [birth year] through 1967.   06/05/[birth year]109*  . Flu Shot  11/29/2017  . Colon Cancer Screening  11/21/2020  . Tetanus Vaccine  08/14/2027  . Pneumonia vaccines  Completed  *Topic was postponed. The date shown is not the original due date.   Preventive Care for Adults  A healthy lifestyle and preventive care can promote health and wellness. Preventive health guidelines for adults include the following key practices.  . A routine yearly physical is a good way to check with your health care provider about your health and preventive screening. It is a chance to share any concerns and updates on your health and to receive a thorough exam.  . Visit your dentist for a routine exam and preventive care every 6 months. Brush your teeth twice a day and floss once a day. Good oral hygiene prevents tooth decay and gum disease.  . The frequency of eye exams is based on your age, health, family medical history, use  of contact lenses, and other factors. Follow your health care provider's recommendations for frequency of eye exams.  . Eat a healthy diet. Foods like vegetables, fruits, whole grains, low-fat dairy products, and lean protein foods contain the nutrients you need without too many calories. Decrease your intake of foods high in solid fats, added sugars, and salt. Eat the right amount of  calories for you. Get information about a proper diet from your health care provider, if necessary.  . Regular physical exercise is one of the most important things you can do for your health. Most adults should get at least 150 minutes of moderate-intensity exercise (any activity that increases your heart rate and causes you to sweat) each week. In addition, most adults need muscle-strengthening exercises on 2 or more days a week.  Silver Sneakers may be a benefit available to you. To determine eligibility, you may visit the website: www.silversneakers.com or contact program at 671-015-9138 Mon-Fri between 8AM-8PM.   . Maintain a healthy weight. The body mass index (BMI) is a screening tool to identify possible weight problems. It provides an estimate of body fat based on height and weight. Your health care provider can find your BMI and can help you achieve or maintain a healthy weight.   For adults 20 years and older: ? A BMI below 18.5 is considered underweight. ? A BMI of 18.5 to 24.9 is normal. ? A BMI of 25 to 29.9 is considered overweight. ? A BMI of 30 and above is considered obese.   . Maintain normal blood lipids and cholesterol levels by exercising and minimizing your intake of saturated fat. Eat a balanced diet with plenty of fruit and vegetables. Blood tests for lipids and cholesterol should begin at age 71 and be repeated  every 5 years. If your lipid or cholesterol levels are high, you are over 50, or you are at high risk for heart disease, you may need your cholesterol levels checked more frequently. Ongoing high lipid and cholesterol levels should be treated with medicines if diet and exercise are not working.  . If you smoke, find out from your health care provider how to quit. If you do not use tobacco, please do not start.  . If you choose to drink alcohol, please do not consume more than 2 drinks per day. One drink is considered to be 12 ounces (355 mL) of beer, 5 ounces  (148 mL) of wine, or 1.5 ounces (44 mL) of liquor.  . If you are 7-70 years old, ask your health care provider if you should take aspirin to prevent strokes.  . Use sunscreen. Apply sunscreen liberally and repeatedly throughout the day. You should seek shade when your shadow is shorter than you. Protect yourself by wearing long sleeves, pants, a wide-brimmed hat, and sunglasses year round, whenever you are outdoors.  . Once a month, do a whole body skin exam, using a mirror to look at the skin on your back. Tell your health care provider of new moles, moles that have irregular borders, moles that are larger than a pencil eraser, or moles that have changed in shape or color.

## 2017-11-28 NOTE — Progress Notes (Signed)
PCP notes: None   Health maintenance: Up to date   Abnormal screenings: Hearing test recommendation is to refer. Patient not interested at this time. We did discuss [facility] as a cost effective option if he decides to pursue. He verbalized understanding   Patient concerns: None   Nurse concerns: None   Next PCP appt: Schedule as needed

## 2017-11-28 NOTE — Progress Notes (Signed)
Subjective:   Carl Lopez is a 67 y.o. male who presents for an Initial Medicare Annual Wellness Visit.  Review of Systems  No ROS.  Medicare Wellness Visit. Additional risk factors are reflected in the social history. Cardiac Risk Factors include: advanced age (>36men, >7 women);male gender   Patient currently lives with wife in a single story home. No pets. Patient enjoys reading Non-fiction. Works in the yard in warm weather. Wife will retire this year and they plan to start traveling. Patient wishes to return to [place] for a couple of weeks.  Patient goes to bed around midnight and gets up around 8:30-9:00am. He does get up once in the middle of the night to go to the bathroom. Wakes feeling rested. Objective:    Today's Vitals   11/28/17 0846  BP: 126/88  Pulse: (!) 59  Temp: 98.7 F (37.1 C)  TempSrc: Oral  SpO2: 96%  Weight: 187 lb (84.8 kg)  Height: 5' 9.5" (1.765 m)   Body mass index is 27.22 kg/m.  Advanced Directives 11/28/2017 11/22/2015 11/08/2015  Does Patient Have a Medical Advance Directive? No No No  Would patient like information on creating a medical advance directive? No - Patient declined - -   We did discuss Advance Directives. He states that his wife and him have recently begun having discussions regarding some of these decisions and they will likely have something drawn up by an attorney. I did offer the package we have here. I asked that he provide our office with a copy that relates to medical decisions and he verbalized understanding. We spent about 18 minutes discussing Advanced Directives.  Current Medications (verified) Outpatient Encounter Medications as of 11/28/2017  Medication Sig  . aspirin 81 MG tablet Take 81 mg by mouth daily.    Marland Kitchen atorvastatin (LIPITOR) 40 MG tablet TAKE 1 TABLET BY MOUTH  DAILY  . levothyroxine (SYNTHROID, LEVOTHROID) 88 MCG tablet Take 2 tablets (176 mcg total) by mouth daily.  . Multiple Vitamins-Minerals  (PRESERVISION AREDS 2 PO) Take 1 tablet by mouth daily.   No facility-administered encounter medications on file as of 11/28/2017.     Allergies (verified) Penicillins   History: Past Medical History:  Diagnosis Date  . BACK PAIN 01/26/2009   1987    . Chronic kidney disease    kidney stone 2017  . DIVERTICULOSIS, COLON 07/29/2007  . Hyperlipidemia   . Thyroid disease    67years old, meds 2-3 years   Past Surgical History:  Procedure Laterality Date  . CHOLECYSTECTOMY  1988  . COLONOSCOPY    . [place]  . INGUINAL HERNIA REPAIR  05-31-10  . POLYPECTOMY     Family History  Problem Relation Age of Onset  . Colon cancer Maternal Uncle   . Colon cancer Other   . Breast cancer Mother        21  . Dementia Father        68  . CVA Father   . Breast cancer Maternal Grandmother   . Diabetes Maternal Grandfather   . Kidney disease Paternal Grandfather   . Esophageal cancer Neg Hx   . Rectal cancer Neg Hx   . Stomach cancer Neg Hx    Social History   Socioeconomic History  . Marital status: Married    Spouse name: Not on file  . Number of children: Not on file  . Years of education: Not on file  . Highest education level: Not on  file  Occupational History  . Not on file  Social Needs  . Financial resource strain: Not on file  . Food insecurity:    Worry: Not on file    Inability: Not on file  . Transportation needs:    Medical: Not on file    Non-medical: Not on file  Tobacco Use  . Smoking status: Never Smoker  . Smokeless tobacco: Never Used  Substance and Sexual Activity  . Alcohol use: Yes    Alcohol/week: 3.0 oz    Types: 5 Glasses of wine per week  . Drug use: No  . Sexual activity: Yes  Lifestyle  . Physical activity:    Days per week: Not on file    Minutes per session: Not on file  . Stress: Not on file  Relationships  . Social connections:    Talks on phone: Not on file    Gets together: Not on file    Attends religious  service: Not on file    Active member of club or organization: Not on file    Attends meetings of clubs or organizations: Not on file    Relationship status: Not on file  Other Topics Concern  . Not on file  Social History Narrative   Married 26 years in 2015. No kids.       Retired from [employer], Electrical engineer rep      Hobbies: yardwork, music, movies, reading   [place] given: Not Answered       Activities of Daily Living In your present state of health, do you have any difficulty performing the following activities: 11/28/2017  Hearing? N  Vision? N  Difficulty concentrating or making decisions? N  Walking or climbing stairs? N  Dressing or bathing? N  Doing errands, shopping? N  Preparing Food and eating ? N  Using the Toilet? N  In the past six months, have you accidently leaked urine? N  Do you have problems with loss of bowel control? N  Managing your Medications? N  Managing your Finances? N  Housekeeping or managing your Housekeeping? N  Some recent data might be hidden     Immunizations and Health Maintenance Immunization History  Administered Date(s) Administered  . Influenza Split 02/27/2011, 02/09/2012  . Influenza Whole 01/26/2009, 02/03/2010  . Influenza, High Dose Seasonal PF 02/09/2016, 01/31/2017  . Influenza,inj,Quad PF,6+ Mos 01/31/2013, 12/29/2013, 12/28/2014  . Pneumococcal Conjugate-13 06/30/2015  . Pneumococcal Polysaccharide-23 12/29/2013  . Td 06/29/2005, 08/13/2017  . Zoster 11/27/2011   There are no preventive care reminders to display for this patient.  Patient Care Team: Marin Olp, MD as PCP - General (Family Medicine)  Indicate any recent Medical Services you may have received from other than [facility] providers in the past year (date may be approximate).    Assessment:   This is a routine wellness examination for Mate.  Hearing/Vision screen  Hearing Screening   Method: Audiometry   125Hz   250Hz  500Hz  1000Hz  2000Hz  3000Hz  4000Hz  6000Hz  8000Hz   Right ear:           Left ear:           Comments: Performed hearing test. Recommendation for both ears is to refer. Discussed with patient who stated he worked for the [employer] for years. He isn't interested in pursuing further work up at this time. We did discuss [employer] as a cost effective option. He verbalized understanding   Visual Acuity Screening   Right eye Left eye Both  eyes  Without correction:     With correction: 20/20 20/20 20/20   Comments: Wears glasses Dr. Gershon Crane   Dietary issues and exercise activities discussed: Current Exercise Habits: The patient does not participate in regular exercise at present, Exercise limited by: None identified   Breakfast: Coffee (espresso) and a bagel Lunch: tomato sandwich and a Coke Dinner: Pork tenderloin and water. Will eat occasional vegetable  Loves salty and sweet, especially if crunchy Goals    . DIET - INCREASE WATER INTAKE     Currently drinks 1-2 bottles a day of water. Would like to increase to 3-4 bottles a day.      Depression Screen PHQ 2/9 Scores 11/28/2017 08/13/2017 06/30/2016 06/30/2015  PHQ - 2 Score 0 0 0 0  PHQ- 9 Score 1 - - -    Fall Risk Fall Risk  11/28/2017 08/13/2017 06/30/2016 06/30/2015 06/29/2014  Falls in the past year? No No No No No        6CIT Screen 11/28/2017  What Year? 0 points  What month? 0 points  What time? 0 points  Count back from 20 0 points  Months in reverse 0 points    Screening Tests Health Maintenance  Topic Date Due  . Hepatitis C Screening  06/05/2109 (Originally [birth year])  . INFLUENZA VACCINE  11/29/2017  . COLONOSCOPY  11/21/2020  . TETANUS/TDAP  08/14/2027  . PNA vac Low Risk Adult  Completed         Plan:    Follow Up with PCP as advised  I have personally reviewed and noted the following in the patient's chart:   . Medical and social history . Use of alcohol, tobacco or illicit drugs  . Current medications  and supplements . Functional ability and status . Nutritional status . Physical activity . Advanced directives . List of other physicians . Vitals . Screenings to include cognitive, depression, and falls . Referrals and appointments  In addition, I have reviewed and discussed with patient certain preventive protocols, quality metrics, and best practice recommendations. A written personalized care plan for preventive services as well as general preventive health recommendations were provided to patient.     [place], [place]   2/68/3419

## 2017-11-28 NOTE — Progress Notes (Signed)
I have personally reviewed the Medicare Annual Wellness questionnaire and have noted 1. The patient's medical and social history 2. Their use of alcohol, tobacco or illicit drugs 3. Their current medications and supplements 4. The patient's functional ability including ADL's, fall risks, home safety risks and hearing or visual impairment. 5. Diet and physical activities 6. Evidence for depression or mood disorders 7. Reviewed Updated provider list, see scanned forms and [facility] Snapshot.   The patients weight, height, BMI and visual acuity have been recorded in the chart I have made referrals, counseling and provided education to the patient based review of the above and I have provided the pt with a written personalized care plan for preventive services.  I have provided the patient with a copy of your personalized plan for preventive services. Instructed to take the time to review along with their updated medication list.  Dennie Moltz, DO  

## 2017-11-30 ENCOUNTER — Other Ambulatory Visit: Payer: Self-pay

## 2017-11-30 DIAGNOSIS — E039 Hypothyroidism, unspecified: Secondary | ICD-10-CM

## 2017-12-07 ENCOUNTER — Other Ambulatory Visit: Payer: Self-pay | Admitting: Family Medicine

## 2018-01-14 ENCOUNTER — Other Ambulatory Visit: Payer: Self-pay

## 2018-01-14 ENCOUNTER — Encounter: Payer: Self-pay | Admitting: Family Medicine

## 2018-01-14 ENCOUNTER — Other Ambulatory Visit (INDEPENDENT_AMBULATORY_CARE_PROVIDER_SITE_OTHER): Payer: Medicare Other

## 2018-01-14 DIAGNOSIS — E039 Hypothyroidism, unspecified: Secondary | ICD-10-CM

## 2018-01-14 DIAGNOSIS — Z0000 Encounter for general adult medical examination without abnormal findings: Secondary | ICD-10-CM

## 2018-01-14 LAB — TSH: TSH: 0.15 u[IU]/mL — AB (ref 0.35–4.50)

## 2018-01-14 MED ORDER — LEVOTHYROXINE SODIUM 150 MCG PO TABS: 150.0000 ug | ORAL_TABLET | Freq: Every day | ORAL | 5 refills | Status: DC

## 2018-01-14 NOTE — Addendum Note (Signed)
Addended by: Marin Olp on: 01/14/2018 12:53 PM   Modules accepted: Orders

## 2018-02-01 ENCOUNTER — Encounter: Payer: Self-pay | Admitting: Family Medicine

## 2018-02-04 ENCOUNTER — Other Ambulatory Visit: Payer: Self-pay

## 2018-02-04 MED ORDER — LEVOTHYROXINE SODIUM 150 MCG PO TABS: 150.0000 ug | ORAL_TABLET | Freq: Every day | ORAL | 3 refills | Status: DC

## 2018-02-05 ENCOUNTER — Ambulatory Visit (INDEPENDENT_AMBULATORY_CARE_PROVIDER_SITE_OTHER): Payer: Medicare Other

## 2018-02-05 DIAGNOSIS — Z23 Encounter for immunization: Secondary | ICD-10-CM

## 2018-02-05 NOTE — Progress Notes (Signed)
Patient here today for Flu shot. VIS given. Administered in left arm. Patient tolerated well

## 2018-02-05 NOTE — Patient Instructions (Signed)
There are no preventive care reminders to display for this patient.  Depression screen [facility] 2/9 11/28/2017 08/13/2017 06/30/2016  Decreased Interest 0 0 0  Down, Depressed, Hopeless 0 0 0  PHQ - 2 Score 0 0 0  Altered sleeping 0 - -  Tired, decreased energy 1 - -  Change in appetite 0 - -  Feeling bad or failure about yourself  0 - -  Trouble concentrating 0 - -  Moving slowly or fidgety/restless 0 - -  Suicidal thoughts 0 - -  PHQ-9 Score 1 - -  Difficult doing work/chores Not difficult at all - -

## 2018-02-28 ENCOUNTER — Other Ambulatory Visit: Payer: Self-pay

## 2018-02-28 NOTE — Patient Outreach (Signed)
[facility]) Care Management  02/28/2018  CARSTON RIEDL [birth year] [id number]   Medication Adherence call to Mr. Danni Leabo left a message for patient to call back patient is due on Atorvastatin 40 mg under [place].   [place] Management Direct Dial [phone]  Fax [phone] [email or web address]

## 2018-03-01 DIAGNOSIS — H2513 Age-related nuclear cataract, bilateral: Secondary | ICD-10-CM | POA: Diagnosis not present

## 2018-08-20 ENCOUNTER — Encounter: Payer: Medicare Other | Admitting: Family Medicine

## 2018-09-06 ENCOUNTER — Other Ambulatory Visit: Payer: Self-pay | Admitting: Family Medicine

## 2018-09-06 NOTE — Telephone Encounter (Signed)
Last OV 08/30/2017 Last refill 12/07/17 #90/1 Next OV CPE 10/14/2018

## 2018-10-14 ENCOUNTER — Other Ambulatory Visit: Payer: Self-pay

## 2018-10-14 ENCOUNTER — Encounter: Payer: Self-pay | Admitting: Family Medicine

## 2018-10-14 ENCOUNTER — Ambulatory Visit (INDEPENDENT_AMBULATORY_CARE_PROVIDER_SITE_OTHER): Payer: Medicare Other | Admitting: Family Medicine

## 2018-10-14 VITALS — BP 136/82 | HR 71 | Temp 98.8°F | Ht 69.5 in | Wt 191.0 lb

## 2018-10-14 DIAGNOSIS — Z125 Encounter for screening for malignant neoplasm of prostate: Secondary | ICD-10-CM

## 2018-10-14 DIAGNOSIS — Z0001 Encounter for general adult medical examination with abnormal findings: Secondary | ICD-10-CM

## 2018-10-14 DIAGNOSIS — R739 Hyperglycemia, unspecified: Secondary | ICD-10-CM

## 2018-10-14 DIAGNOSIS — E663 Overweight: Secondary | ICD-10-CM | POA: Diagnosis not present

## 2018-10-14 DIAGNOSIS — Z0000 Encounter for general adult medical examination without abnormal findings: Secondary | ICD-10-CM

## 2018-10-14 DIAGNOSIS — E785 Hyperlipidemia, unspecified: Secondary | ICD-10-CM

## 2018-10-14 DIAGNOSIS — L570 Actinic keratosis: Secondary | ICD-10-CM

## 2018-10-14 DIAGNOSIS — E039 Hypothyroidism, unspecified: Secondary | ICD-10-CM

## 2018-10-14 LAB — COMPREHENSIVE METABOLIC PANEL
ALT: 12 U/L (ref 0–53)
AST: 14 U/L (ref 0–37)
Albumin: 4.2 g/dL (ref 3.5–5.2)
Alkaline Phosphatase: 89 U/L (ref 39–117)
BUN: 15 mg/dL (ref 6–23)
CO2: 28 mEq/L (ref 19–32)
Calcium: 9 mg/dL (ref 8.4–10.5)
Chloride: 102 mEq/L (ref 96–112)
Creatinine, Ser: 0.96 mg/dL (ref 0.40–1.50)
GFR: 77.4 mL/min (ref >60.00)
Glucose, Bld: 105 mg/dL — ABNORMAL HIGH (ref 70–99)
Potassium: 4.2 mEq/L (ref 3.5–5.1)
Sodium: 138 mEq/L (ref 135–145)
Total Bilirubin: 1 mg/dL (ref 0.2–1.2)
Total Protein: 6.3 g/dL (ref 6.0–8.3)

## 2018-10-14 LAB — CBC
HCT: 45.5 % (ref 39.0–52.0)
Hemoglobin: 15.7 g/dL (ref 13.0–17.0)
MCHC: 34.5 g/dL (ref 30.0–36.0)
MCV: 94.6 fl (ref 78.0–100.0)
Platelets: 239 10*3/uL (ref 150.0–400.0)
RBC: 4.81 Mil/uL (ref 4.22–5.81)
RDW: 13.2 % (ref 11.5–15.5)
WBC: 6.1 10*3/uL (ref 4.0–10.5)

## 2018-10-14 LAB — LIPID PANEL
Cholesterol: 144 mg/dL (ref 0–200)
HDL: 44.4 mg/dL (ref >39.00)
LDL Cholesterol: 70 mg/dL (ref 0–99)
NonHDL: 99.48
Total CHOL/HDL Ratio: 3
Triglycerides: 149 mg/dL (ref 0.0–149.0)
VLDL: 29.8 mg/dL (ref 0.0–40.0)

## 2018-10-14 LAB — TSH: TSH: 2.03 u[IU]/mL (ref 0.35–4.50)

## 2018-10-14 LAB — PSA: PSA: 1.09 ng/mL (ref 0.10–4.00)

## 2018-10-14 LAB — HEMOGLOBIN A1C: Hgb A1c MFr Bld: 5.7 % (ref 4.6–6.5)

## 2018-10-14 MED ORDER — ATORVASTATIN CALCIUM 40 MG PO TABS: 40.0000 mg | ORAL_TABLET | Freq: Every day | ORAL | 3 refills | Status: DC

## 2018-10-14 NOTE — Progress Notes (Signed)
Phone: [phone]   Subjective:  Patient presents today for their annual physical. Chief complaint-noted.   See problem oriented charting- ROS- full  review of systems was completed and negative except for: some heat intolerance (narrow temperatures he is comfortable with), wife states he is sweaty a lot but also gaining weight. He doesn't think sweatiness is new but wife thinks it is more noticeable  The following were reviewed and entered/updated in epic: Past Medical History:  Diagnosis Date  . BACK PAIN 01/26/2009   1987    . Chronic kidney disease    kidney stone 2017  . DIVERTICULOSIS, COLON 07/29/2007  . Hyperlipidemia   . Thyroid disease    68years old, meds 2-3 years   Patient Active Problem List   Diagnosis Date Noted  . Hypothyroidism 07/29/2007    Priority: Medium  . Hyperlipidemia 07/29/2007    Priority: Medium  . Nephrolithiasis 06/30/2015    Priority: Low  . Skin lesion 06/29/2014    Priority: Low  . Hyperglycemia 06/29/2014    Priority: Low  . Abdominal mass 01/12/2014    Priority: Low  . SNORING 08/03/2008    Priority: Low  . COLONIC POLYPS, HX OF 07/29/2007    Priority: Low   Past Surgical History:  Procedure Laterality Date  . CHOLECYSTECTOMY  1988  . COLONOSCOPY    . [place]  . INGUINAL HERNIA REPAIR  05-31-10  . POLYPECTOMY      Family History  Problem Relation Age of Onset  . Colon cancer Maternal Uncle   . Colon cancer Other   . Breast cancer Mother        18  . Dementia Father        32  . CVA Father   . Breast cancer Maternal Grandmother   . Diabetes Maternal Grandfather   . Kidney disease Paternal Grandfather   . Esophageal cancer Neg Hx   . Rectal cancer Neg Hx   . Stomach cancer Neg Hx     Medications- reviewed and updated Current Outpatient Medications  Medication Sig Dispense Refill  . aspirin 81 MG tablet Take 81 mg by mouth daily.      Marland Kitchen atorvastatin (LIPITOR) 40 MG tablet TAKE 1 TABLET BY  MOUTH  DAILY 90 tablet 0  . levothyroxine (SYNTHROID, LEVOTHROID) 150 MCG tablet Take 1 tablet (150 mcg total) by mouth daily. 90 tablet 3  . Multiple Vitamins-Minerals (PRESERVISION AREDS 2 PO) Take 1 tablet by mouth daily.     No current facility-administered medications for this visit.     Allergies-reviewed and updated Allergies  Allergen Reactions  . Penicillins Swelling and Other (See Comments)    Unknown Has patient had a PCN reaction causing immediate rash, facial/tongue/throat swelling, SOB or lightheadedness with hypotension:  unknown  Has patient had a PCN reaction causing severe rash involving mucus membranes or skin necrosis: unknown Has patient had a PCN reaction that required hospitalization : unknown,  Has patient had a PCN reaction occurring within the last 10 years: No Childhood allergy, swelling of arm after injection of penicillin    Social History   Social History Narrative   Married 26 years in 2015. No kids.       Retired from [employer], Electrical engineer rep      Hobbies: yardwork, music, movies, reading   Objective  Objective:  BP 136/82   Pulse 71   Temp 98.8 F (37.1 C) (Oral)   Ht 5' 9.5" (1.765 m)  Wt 191 lb (86.6 kg)   SpO2 98%   BMI 27.80 kg/m  Gen: NAD, resting comfortably HEENT: Mucous membranes are moist. Oropharynx normal Neck: no thyromegaly CV: RRR no murmurs rubs or gallops Lungs: CTAB no crackles, wheeze, rhonchi Abdomen: soft/nontender/nondistended/normal bowel sounds. No rebound or guarding.  Ext: no edema Skin: warm, dry, left forehead has slightly scaly lesion with erythematous base under 4 mm x 77mm Neuro: grossly normal, moves all extremities, PERRLA  Procedure note: Benefits and risks verbally discussed with patient 5 second freeze thaw cycle of cryotherapy performed  with liquid nitrogen to left scalp (likely AK) No complications.  Patient tolerated the procedure well other than mild pain. Gave handout on this  from [facility] [place] and we reviewed this content thoroughly [email or web address]    Assessment and Plan  68 y.o. male presenting for annual physical.  Health Maintenance counseling: 1. Anticipatory guidance: Patient counseled regarding regular dental exams -q6 months, eye exams -yearly,  avoiding smoking and second hand smoke , limiting alcohol to 2 beverages per day .   2. Risk factor reduction:  Advised patient of need for regular exercise and diet rich and fruits and vegetables to reduce risk of heart attack and stroke. Exercise- exercise has been lower with covid 19- he is trying to get out in yard- has not done walking- doesn't really enjoy it. Diet-typically eats a big dinner and lighter rest of day- with wife home over last year eating more frequently- eating reasonably health.  Weight up 2 pounds from last physical- feels like he has gained more than that.perhaps a little more wine than usual with boredom.   Wt Readings from Last 3 Encounters:  10/14/18 191 lb (86.6 kg)  11/28/17 187 lb (84.8 kg)  08/30/17 186 lb 12.8 oz (84.7 kg)  3. Immunizations/screenings/ancillary studies-can consider Shingrix at pharmacy though given side effect profile of Shingrix and overlap with COVID-19 symptoms would be reasonable to postpone  Immunization History  Administered Date(s) Administered  . Influenza Split 02/27/2011, 02/09/2012  . Influenza Whole 01/26/2009, 02/03/2010  . Influenza, High Dose Seasonal PF 02/09/2016, 01/31/2017, 02/05/2018  . Influenza,inj,Quad PF,6+ Mos 01/31/2013, 12/29/2013, 12/28/2014  . Pneumococcal Conjugate-13 06/30/2015  . Pneumococcal Polysaccharide-23 12/29/2013  . Td 06/29/2005, 08/13/2017  . Zoster 11/27/2011   4. Prostate cancer screening-refer to urology last year after elevated psa up to 6- thankfully came right back down to 0.6. we will check again at least this year.   Lab Results  Component Value Date    PSA 0.60 09/10/2017   PSA 6.05 (H) 08/06/2017   PSA 0.79 06/23/2016   5. Colon cancer screening - July 2017 with 5-year repeat due to adenoma  6. Skin cancer screening- no dermatologist. advised regular sunscreen use. Denies worrisome, changing, or new skin lesions.  7. Never smoker  Status of chronic or acute concerns  Cryo left forehead completed for likely actinic keratosis. Would be willing to try longer freeze/thaw cycle in future if needed up to 10 seconds or could refer to dermatology ir recurs  # daytime sleepiness- if sitting for a while falls asleep even though he has been resting well at night. Feels rested in morning. Used to snore when heavier - a lot less than it used to be. Wakes up at periods with gasping for air.  - he wants to consider OSA testing- he will let me know if he opts in - tested 15 years ago but didn't get a good test- we discussed at home testing potentially   #  Hyperglycemia- CBGs have been high risk for diabetes but A1c has been normal.  #Hyperlipidemia- patient compliant with atorvastatin 40 mg.  Update lipid panel today  #Hypothyroidism-remains on levothyroxine 150 mcg.  Update TSH today Lab Results  Component Value Date   TSH 0.15 (L) 01/14/2018   #Abdominal mass-possible foreign body related to prior surgery.  We are watching the clinical exam. No issues recently with this  Return in about 6 months (around 04/15/2019) for follow up- or sooner if needed.  Lab/Order associations:fasting    ICD-10-CM   1. Preventative health care  Z00.00 CBC    Comprehensive metabolic panel    Lipid panel    PSA    Hemoglobin A1c    TSH  2. Overweight  E66.3   3. Hypothyroidism, unspecified type  E03.9 TSH  4. Hyperlipidemia, unspecified hyperlipidemia type  E78.5 CBC    Comprehensive metabolic panel    Lipid panel  5. Hyperglycemia  R73.9 Hemoglobin A1c  6. Screening for prostate cancer  Z12.5 PSA  7. Actinic keratosis  L57.0     Return precautions  advised.  Garret Reddish, MD

## 2018-10-14 NOTE — Patient Instructions (Addendum)
Please stop by lab before you go If you do not have mychart- we will call you about results within 5 business days of [place] receiving them.  If you have mychart- we will send your results within 3 business days of [place] receiving them.  If abnormal or we want to clarify a result, we will call or mychart you to make sure you receive the message.  If you have questions or concerns or don't hear within 5-7 days, please send [place] a message or call us.    Team- please give cyrotherapy  Let us know if you want to try referral for sleep study again   Target 150 minutes exercise per week

## 2018-12-06 ENCOUNTER — Other Ambulatory Visit: Payer: Self-pay | Admitting: Family Medicine

## 2018-12-17 ENCOUNTER — Ambulatory Visit: Payer: Medicare Other

## 2019-01-09 ENCOUNTER — Encounter: Payer: Self-pay | Admitting: Family Medicine

## 2019-02-05 ENCOUNTER — Encounter: Payer: Self-pay | Admitting: Family Medicine

## 2019-02-05 ENCOUNTER — Other Ambulatory Visit: Payer: Self-pay

## 2019-02-05 ENCOUNTER — Ambulatory Visit (INDEPENDENT_AMBULATORY_CARE_PROVIDER_SITE_OTHER): Payer: Medicare Other

## 2019-02-05 DIAGNOSIS — Z23 Encounter for immunization: Secondary | ICD-10-CM | POA: Diagnosis not present

## 2019-03-03 ENCOUNTER — Encounter: Payer: Self-pay | Admitting: Family Medicine

## 2019-03-18 DIAGNOSIS — H2513 Age-related nuclear cataract, bilateral: Secondary | ICD-10-CM | POA: Diagnosis not present

## 2019-04-30 ENCOUNTER — Telehealth: Payer: Self-pay | Admitting: Family Medicine

## 2019-04-30 NOTE — Telephone Encounter (Signed)
I left a message asking the patient to call and schedule Medicare AWV with Loma Sousa ([place]).  If patient calls back, please schedule Medicare Wellness Visit at next available opening. Last AWV 7/31/19VDM (Dee-Dee)

## 2019-05-21 ENCOUNTER — Ambulatory Visit: Payer: Medicare Other | Attending: Anesthesiology

## 2019-05-21 DIAGNOSIS — Z23 Encounter for immunization: Secondary | ICD-10-CM | POA: Insufficient documentation

## 2019-05-21 NOTE — Progress Notes (Signed)
   Covid-19 Vaccination Clinic  Name:  Carl Lopez    MRN: [id number] DOB: 10-03-1950  05/21/2019  [name] was observed post Covid-19 immunization for 15 minutes without incidence. He was provided with Vaccine Information Sheet and instruction to access the V-Safe system.   [name] was instructed to call 911 with any severe reactions post vaccine: Marland Kitchen Difficulty breathing  . Swelling of your face and throat  . A fast heartbeat  . A bad rash all over your body  . Dizziness and weakness    Immunizations Administered    Name Date Dose VIS Date Route   Pfizer COVID-19 Vaccine 05/21/2019  3:02 PM 0.3 mL 04/11/2019 Intramuscular   Manufacturer: [place]   Lot: [id number]   [place]: [id number]

## 2019-06-11 ENCOUNTER — Ambulatory Visit: Payer: Medicare Other | Attending: Internal Medicine

## 2019-06-11 DIAGNOSIS — Z23 Encounter for immunization: Secondary | ICD-10-CM | POA: Insufficient documentation

## 2019-06-11 NOTE — Progress Notes (Signed)
   Covid-19 Vaccination Clinic  Name:  Carl Lopez    MRN: [id number] DOB: 09/13/1950  06/11/2019  [name] was observed post Covid-19 immunization for 15 minutes without incidence. He was provided with Vaccine Information Sheet and instruction to access the V-Safe system.   [name] was instructed to call 911 with any severe reactions post vaccine: Marland Kitchen Difficulty breathing  . Swelling of your face and throat  . A fast heartbeat  . A bad rash all over your body  . Dizziness and weakness    Immunizations Administered    Name Date Dose VIS Date Route   Pfizer COVID-19 Vaccine 06/11/2019 10:40 AM 0.3 mL 04/11/2019 Intramuscular   Manufacturer: [employer]   Lot: [id number]   [place]: [id number]

## 2019-08-18 ENCOUNTER — Telehealth: Payer: Self-pay | Admitting: Family Medicine

## 2019-08-18 NOTE — Telephone Encounter (Signed)
I left a message asking the patient to call and schedule Medicare AWV with Loma Sousa ([place]).  If patient calls back, please schedule Medicare Wellness Visit at next available opening. Last AWV 11/28/2017 VDM (Dee-Dee)

## 2019-08-26 ENCOUNTER — Other Ambulatory Visit: Payer: Self-pay

## 2019-08-26 ENCOUNTER — Ambulatory Visit (INDEPENDENT_AMBULATORY_CARE_PROVIDER_SITE_OTHER): Payer: Medicare Other

## 2019-08-26 VITALS — BP 130/68 | HR 64 | Temp 98.4°F | Ht 70.0 in | Wt 190.6 lb

## 2019-08-26 DIAGNOSIS — Z0000 Encounter for general adult medical examination without abnormal findings: Secondary | ICD-10-CM

## 2019-08-26 NOTE — Progress Notes (Signed)
Subjective:   KHYRON HOLLENBERG is a 69 y.o. male who presents for Medicare Annual/Subsequent preventive examination.  Review of Systems:   Cardiac Risk Factors include: advanced age (>85men, >69 women);male gender;dyslipidemia    Objective:    Vitals: BP 130/68   Pulse 64   Temp 98.4 F (36.9 C) (Temporal)   Ht 5\' 10"  (1.778 m)   Wt 190 lb 9.6 oz (86.5 kg)   SpO2 97%   BMI 27.35 kg/m   Body mass index is 27.35 kg/m.  Advanced Directives 08/26/2019 11/28/2017 11/22/2015 11/08/2015  Does Patient Have a Medical Advance Directive? No No No No  Would patient like information on creating a medical advance directive? Yes (MAU/Ambulatory/Procedural Areas - Information given) No - Patient declined - -    Tobacco Social History   Tobacco Use  Smoking Status Never Smoker  Smokeless Tobacco Never Used     Counseling given: Not Answered   Clinical Intake:  Pre-visit preparation completed: Yes  Pain : No/denies pain  Diabetes: No  How often do you need to have someone help you when you read instructions, pamphlets, or other written materials from your doctor or pharmacy?: 1 - Never  Interpreter Needed?: No  Information entered by :: Denman George LPN  Past Medical History:  Diagnosis Date  . BACK PAIN 01/26/2009   1987    . Chronic kidney disease    kidney stone 2017  . DIVERTICULOSIS, COLON 07/29/2007  . Hyperlipidemia   . Thyroid disease    69years old, meds 2-3 years   Past Surgical History:  Procedure Laterality Date  . CHOLECYSTECTOMY  1988  . COLONOSCOPY    . [place]  . INGUINAL HERNIA REPAIR  05-31-10  . POLYPECTOMY     Family History  Problem Relation Age of Onset  . Colon cancer Maternal Uncle   . Colon cancer Other   . Breast cancer Mother        62  . Dementia Father        37  . CVA Father   . Breast cancer Maternal Grandmother   . Diabetes Maternal Grandfather   . Kidney disease Paternal Grandfather   . Esophageal  cancer Neg Hx   . Rectal cancer Neg Hx   . Stomach cancer Neg Hx    Social History   Socioeconomic History  . Marital status: Married    Spouse name: Not on file  . Number of children: Not on file  . Years of education: Not on file  . Highest education level: Not on file  Occupational History  . Not on file  Tobacco Use  . Smoking status: Never Smoker  . Smokeless tobacco: Never Used  Substance and Sexual Activity  . Alcohol use: Yes    Alcohol/week: 5.0 standard drinks    Types: 5 Glasses of wine per week  . Drug use: No  . Sexual activity: Yes  Other Topics Concern  . Not on file  Social History Narrative   Married 26 years in 2015. No kids.       Retired from [employer], Electrical engineer rep      Hobbies: yardwork, music, movies, reading   Social Determinants of Radio broadcast assistant Strain:   . Difficulty of Paying Living Expenses:   Food Insecurity:   . Worried About Charity fundraiser in the Last Year:   . Arboriculturist in the Last Year:   [employer]  Needs:   . Lack of Transportation (Medical):   Marland Kitchen Lack of Transportation (Non-Medical):   Physical Activity:   . Days of Exercise per Week:   . Minutes of Exercise per Session:   Stress:   . Feeling of Stress :   Social Connections:   . Frequency of Communication with Friends and Family:   . Frequency of Social Gatherings with Friends and Family:   . Attends Religious Services:   . Active Member of Clubs or Organizations:   . Attends Archivist Meetings:   Marland Kitchen Marital Status:     Outpatient Encounter Medications as of 08/26/2019  Medication Sig  . aspirin 81 MG tablet Take 81 mg by mouth daily.    Marland Kitchen atorvastatin (LIPITOR) 40 MG tablet Take 1 tablet (40 mg total) by mouth daily.  Marland Kitchen levothyroxine (SYNTHROID) 150 MCG tablet TAKE 1 TABLET BY MOUTH  DAILY  . Multiple Vitamins-Minerals (PRESERVISION AREDS 2 PO) Take 1 tablet by mouth daily.   No facility-administered encounter  medications on file as of 08/26/2019.    Activities of Daily Living In your present state of health, do you have any difficulty performing the following activities: 08/26/2019  Hearing? N  Vision? N  Difficulty concentrating or making decisions? N  Walking or climbing stairs? N  Dressing or bathing? N  Doing errands, shopping? N  Preparing Food and eating ? N  Using the Toilet? N  In the past six months, have you accidently leaked urine? N  Do you have problems with loss of bowel control? N  Managing your Medications? N  Managing your Finances? N  Housekeeping or managing your Housekeeping? N  Some recent data might be hidden    Patient Care Team: Marin Olp, MD as PCP - General (Family Medicine) Rutherford Guys, MD as Consulting Physician (Ophthalmology) Ardis Hughs, MD as Consulting Physician (Urology)   Assessment:   This is a routine wellness examination for Damarien.  Exercise Activities and Dietary recommendations Current Exercise Habits: The patient does not participate in regular exercise at present  Goals    . DIET - INCREASE WATER INTAKE     Currently drinks 1-2 bottles a day of water. Would like to increase to 3-4 bottles a day.       Fall Risk Fall Risk  08/26/2019 10/14/2018 11/28/2017 08/13/2017 06/30/2016  Falls in the past year? 0 0 No No No  Number falls in past yr: 0 - - - -  Injury with Fall? 0 - - - -  Risk for fall due to : No Fall Risks - - - -  Follow up Falls evaluation completed;Education provided;Falls prevention discussed - - - -   Is the patient's home free of loose throw rugs in walkways, pet beds, electrical cords, etc?   yes      Grab bars in the bathroom? yes      Handrails on the stairs?   yes      Adequate lighting?   yes  Timed Get Up and Go Performed: completed and within normal timeframe; no gait abnormalities noted   Depression Screen PHQ 2/9 Scores 08/26/2019 10/14/2018 11/28/2017 08/13/2017  PHQ - 2 Score 0 0 0 0  PHQ- 9  Score - - 1 -    Cognitive Function     6CIT Screen 08/26/2019 11/28/2017  What Year? 0 points 0 points  What month? 0 points 0 points  What time? 0 points 0 points  Count back from 20 0  points 0 points  Months in reverse 0 points 0 points  Repeat phrase 0 points -  Total Score 0 -    Immunization History  Administered Date(s) Administered  . Fluad Quad(high Dose 65+) 02/05/2019  . Influenza Split 02/27/2011, 02/09/2012  . Influenza Whole 01/26/2009, 02/03/2010  . Influenza, High Dose Seasonal PF 02/09/2016, 01/31/2017, 02/05/2018  . Influenza,inj,Quad PF,6+ Mos 01/31/2013, 12/29/2013, 12/28/2014  . PFIZER SARS-COV-2 Vaccination 05/21/2019, 06/11/2019  . Pneumococcal Conjugate-13 06/30/2015  . Pneumococcal Polysaccharide-23 12/29/2013  . Td 06/29/2005, 08/13/2017  . Zoster 11/27/2011    Qualifies for Shingles Vaccine? Shingrix completed   Screening Tests Health Maintenance  Topic Date Due  . Hepatitis C Screening  06/05/2109 (Originally 1950-05-27)  . INFLUENZA VACCINE  11/30/2019  . COLONOSCOPY  11/21/2020  . TETANUS/TDAP  08/14/2027  . COVID-19 Vaccine  Completed  . PNA vac Low Risk Adult  Completed   Cancer Screenings: Lung: Low Dose CT Chest recommended if Age 47-80 years, 30 pack-year currently smoking OR have quit w/in 15years. Patient does not qualify. Colorectal: colonoscopy 11/22/15 with Dr. Fuller Plan    Plan:  I have personally reviewed and addressed the Medicare Annual Wellness questionnaire and have noted the following in the patient's chart:  A. Medical and social history B. Use of alcohol, tobacco or illicit drugs  C. Current medications and supplements D. Functional ability and status E.  Nutritional status F.  Physical activity G. Advance directives H. List of other physicians I.  Hospitalizations, surgeries, and ER visits in previous 12 months J.  [place] such as hearing and vision if needed, cognitive and depression L. Referrals,  records requested, and appointments- will request Shingrix information from pharmacy   In addition, I have reviewed and discussed with patient certain preventive protocols, quality metrics, and best practice recommendations. A written personalized care plan for preventive services as well as general preventive health recommendations were provided to patient.   Signed,  Denman George, LPN  Nurse Health Advisor   Nurse Notes: no additional

## 2019-08-26 NOTE — Patient Instructions (Addendum)
Carl Lopez , Thank you for taking time to come for your Medicare Wellness Visit. I appreciate your ongoing commitment to your health goals. Please review the following plan we discussed and let me know if I can assist you in the future.   Screening recommendations/referrals: Colorectal Screening: up to date; last colonoscopy 11/22/15  Vision and Dental Exams: Recommended annual ophthalmology exams for early detection of glaucoma and other disorders of the eye Recommended annual dental exams for proper oral hygiene  Vaccinations: Influenza vaccine: completed 02/05/19 Pneumococcal vaccine: up to date; last 06/30/15 Tdap vaccine: up to date; last 08/13/17  Shingles vaccine:  Completed  Covid vaccine: Completed   Advanced directives: Please bring a copy of your POA (Power of Attorney) and/or Living Will to your next appointment.  Goals: Recommend to drink at least 6-8 8oz glasses of water per day and consume a balanced diet rich in fresh fruits and vegetables.   Next appointment: Please schedule your Annual Wellness Visit with your Nurse Health Advisor in one year.  Preventive Care 71 Years and Older, Male Preventive care refers to lifestyle choices and visits with your health care provider that can promote health and wellness. What does preventive care include?  A yearly physical exam. This is also called an annual well check.  Dental exams once or twice a year.  Routine eye exams. Ask your health care provider how often you should have your eyes checked.  Personal lifestyle choices, including:  Daily care of your teeth and gums.  Regular physical activity.  Eating a healthy diet.  Avoiding tobacco and drug use.  Limiting alcohol use.  Practicing safe sex.  Taking low doses of aspirin every day if recommended by your health care provider..  Taking vitamin and mineral supplements as recommended by your health care provider. What happens during an annual well check? The  services and screenings done by your health care provider during your annual well check will depend on your age, overall health, lifestyle risk factors, and family history of disease. Counseling  Your health care provider may ask you questions about your:  Alcohol use.  Tobacco use.  Drug use.  Emotional well-being.  Home and relationship well-being.  Sexual activity.  Eating habits.  History of falls.  Memory and ability to understand (cognition).  Work and work Statistician. Screening  You may have the following tests or measurements:  Height, weight, and BMI.  Blood pressure.  Lipid and cholesterol levels. These may be checked every 5 years, or more frequently if you are over 93 years old.  Skin check.  Lung cancer screening. You may have this screening every year starting at age 104 if you have a 30-pack-year history of smoking and currently smoke or have quit within the past 15 years.  Fecal occult blood test (FOBT) of the stool. You may have this test every year starting at age 96.  Flexible sigmoidoscopy or colonoscopy. You may have a sigmoidoscopy every 5 years or a colonoscopy every 10 years starting at age 60.  Prostate cancer screening. Recommendations will vary depending on your family history and other risks.  Hepatitis C blood test.  Hepatitis B blood test.  Sexually transmitted disease (STD) testing.  Diabetes screening. This is done by checking your blood sugar (glucose) after you have not eaten for a while (fasting). You may have this done every 1-3 years.  Abdominal aortic aneurysm (AAA) screening. You may need this if you are a current or former smoker.  Osteoporosis. You  may be screened starting at age 24 if you are at high risk. Talk with your health care provider about your test results, treatment options, and if necessary, the need for more tests. Vaccines  Your health care provider may recommend certain vaccines, such as:  Influenza  vaccine. This is recommended every year.  Tetanus, diphtheria, and acellular pertussis (Tdap, Td) vaccine. You may need a Td booster every 10 years.  Zoster vaccine. You may need this after age 19.  Pneumococcal 13-valent conjugate (PCV13) vaccine. One dose is recommended after age 13.  Pneumococcal polysaccharide (PPSV23) vaccine. One dose is recommended after age 63. Talk to your health care provider about which screenings and vaccines you need and how often you need them. This information is not intended to replace advice given to you by your health care provider. Make sure you discuss any questions you have with your health care provider. Document Released: 05/14/2015 Document Revised: 01/05/2016 Document Reviewed: 02/16/2015 Elsevier Interactive Patient Education  2017 [place] Prevention in the Home Falls can cause injuries. They can happen to people of all ages. There are many things you can do to make your home safe and to help prevent falls. What can I do on the outside of my home?  Regularly fix the edges of walkways and driveways and fix any cracks.  Remove anything that might make you trip as you walk through a door, such as a raised step or threshold.  Trim any bushes or trees on the path to your home.  Use bright outdoor lighting.  Clear any walking paths of anything that might make someone trip, such as rocks or tools.  Regularly check to see if handrails are loose or broken. Make sure that both sides of any steps have handrails.  Any raised decks and porches should have guardrails on the edges.  Have any leaves, snow, or ice cleared regularly.  Use sand or salt on walking paths during winter.  Clean up any spills in your garage right away. This includes oil or grease spills. What can I do in the bathroom?  Use night lights.  Install grab bars by the toilet and in the tub and shower. Do not use towel bars as grab bars.  Use non-skid mats or decals  in the tub or shower.  If you need to sit down in the shower, use a plastic, non-slip stool.  Keep the floor dry. Clean up any water that spills on the floor as soon as it happens.  Remove soap buildup in the tub or shower regularly.  Attach bath mats securely with double-sided non-slip rug tape.  Do not have throw rugs and other things on the floor that can make you trip. What can I do in the bedroom?  Use night lights.  Make sure that you have a light by your bed that is easy to reach.  Do not use any sheets or blankets that are too big for your bed. They should not hang down onto the floor.  Have a firm chair that has side arms. You can use this for support while you get dressed.  Do not have throw rugs and other things on the floor that can make you trip. What can I do in the kitchen?  Clean up any spills right away.  Avoid walking on wet floors.  Keep items that you use a lot in easy-to-reach places.  If you need to reach something above you, use a strong step stool that  has a grab bar.  Keep electrical cords out of the way.  Do not use floor polish or wax that makes floors slippery. If you must use wax, use non-skid floor wax.  Do not have throw rugs and other things on the floor that can make you trip. What can I do with my stairs?  Do not leave any items on the stairs.  Make sure that there are handrails on both sides of the stairs and use them. Fix handrails that are broken or loose. Make sure that handrails are as long as the stairways.  Check any carpeting to make sure that it is firmly attached to the stairs. Fix any carpet that is loose or worn.  Avoid having throw rugs at the top or bottom of the stairs. If you do have throw rugs, attach them to the floor with carpet tape.  Make sure that you have a light switch at the top of the stairs and the bottom of the stairs. If you do not have them, ask someone to add them for you. What else can I do to help  prevent falls?  Wear shoes that:  Do not have high heels.  Have rubber bottoms.  Are comfortable and fit you well.  Are closed at the toe. Do not wear sandals.  If you use a stepladder:  Make sure that it is fully opened. Do not climb a closed stepladder.  Make sure that both sides of the stepladder are locked into place.  Ask someone to hold it for you, if possible.  Clearly mark and make sure that you can see:  Any grab bars or handrails.  First and last steps.  Where the edge of each step is.  Use tools that help you move around (mobility aids) if they are needed. These include:  Canes.  Walkers.  Scooters.  Crutches.  Turn on the lights when you go into a dark area. Replace any light bulbs as soon as they burn out.  Set up your furniture so you have a clear path. Avoid moving your furniture around.  If any of your floors are uneven, fix them.  If there are any pets around you, be aware of where they are.  Review your medicines with your doctor. Some medicines can make you feel dizzy. This can increase your chance of falling. Ask your doctor what other things that you can do to help prevent falls. This information is not intended to replace advice given to you by your health care provider. Make sure you discuss any questions you have with your health care provider. Document Released: 02/11/2009 Document Revised: 09/23/2015 Document Reviewed: 05/22/2014 Elsevier Interactive Patient Education  2017 [employer].

## 2019-11-04 ENCOUNTER — Other Ambulatory Visit: Payer: Self-pay

## 2019-11-04 ENCOUNTER — Encounter: Payer: Self-pay | Admitting: Family Medicine

## 2019-11-04 ENCOUNTER — Ambulatory Visit (INDEPENDENT_AMBULATORY_CARE_PROVIDER_SITE_OTHER): Payer: Medicare Other | Admitting: Family Medicine

## 2019-11-04 VITALS — BP 110/80 | HR 70 | Temp 98.4°F | Ht 70.0 in | Wt 190.4 lb

## 2019-11-04 DIAGNOSIS — Z0000 Encounter for general adult medical examination without abnormal findings: Secondary | ICD-10-CM

## 2019-11-04 DIAGNOSIS — R400 Somnolence: Secondary | ICD-10-CM

## 2019-11-04 DIAGNOSIS — E785 Hyperlipidemia, unspecified: Secondary | ICD-10-CM

## 2019-11-04 DIAGNOSIS — G2581 Restless legs syndrome: Secondary | ICD-10-CM

## 2019-11-04 DIAGNOSIS — E039 Hypothyroidism, unspecified: Secondary | ICD-10-CM | POA: Diagnosis not present

## 2019-11-04 DIAGNOSIS — R739 Hyperglycemia, unspecified: Secondary | ICD-10-CM

## 2019-11-04 DIAGNOSIS — Z87898 Personal history of other specified conditions: Secondary | ICD-10-CM

## 2019-11-04 DIAGNOSIS — Z125 Encounter for screening for malignant neoplasm of prostate: Secondary | ICD-10-CM

## 2019-11-04 NOTE — Patient Instructions (Addendum)
Return tomorrow for your scheduled lab visit.  We will call you within two weeks about your referral to Pulmonolgy. If you do not hear within 3 weeks, give [place] a call.   Goal 5-10lbs weight loss by next year with diet and exercise.

## 2019-11-04 NOTE — Progress Notes (Signed)
Phone: [phone]   Subjective:  Patient presents today for their annual physical. Chief complaint-noted.   See problem oriented charting- Review of Systems  Constitutional: Negative for chills and fever.  HENT: Negative for ear discharge and hearing loss.   Eyes: Negative for blurred vision and double vision.  Respiratory: Negative for cough and shortness of breath.   Cardiovascular: Negative for chest pain and palpitations.  Gastrointestinal: Negative for abdominal pain, heartburn, nausea and vomiting.  Genitourinary: Positive for frequency (nocturia 1-2x a night). Negative for dysuria.  Musculoskeletal: Negative for falls and neck pain.  Skin: Negative for itching and rash.  Neurological: Negative for dizziness and headaches.  Endo/Heme/Allergies: Negative for polydipsia. Does not bruise/bleed easily.  Psychiatric/Behavioral: Negative for substance abuse and suicidal ideas.    The following were reviewed and entered/updated in epic: Past Medical History:  Diagnosis Date  . BACK PAIN 01/26/2009   1987    . Chronic kidney disease    kidney stone 2017  . DIVERTICULOSIS, COLON 07/29/2007  . Hyperlipidemia   . Thyroid disease    69years old, meds 2-3 years   Patient Active Problem List   Diagnosis Date Noted  . Hypothyroidism 07/29/2007    Priority: Medium  . Hyperlipidemia 07/29/2007    Priority: Medium  . Nephrolithiasis 06/30/2015    Priority: Low  . Skin lesion 06/29/2014    Priority: Low  . Hyperglycemia 06/29/2014    Priority: Low  . Abdominal mass 01/12/2014    Priority: Low  . SNORING 08/03/2008    Priority: Low  . COLONIC POLYPS, HX OF 07/29/2007    Priority: Low   Past Surgical History:  Procedure Laterality Date  . CHOLECYSTECTOMY  1988  . COLONOSCOPY    . [place]  . INGUINAL HERNIA REPAIR  05-31-10  . POLYPECTOMY      Family History  Problem Relation Age of Onset  . Colon cancer Maternal Uncle   . Colon cancer Other     . Breast cancer Mother        29  . Dementia Father        44  . CVA Father   . Breast cancer Maternal Grandmother   . Diabetes Maternal Grandfather   . Kidney disease Paternal Grandfather   . Esophageal cancer Neg Hx   . Rectal cancer Neg Hx   . Stomach cancer Neg Hx     Medications- reviewed and updated Current Outpatient Medications  Medication Sig Dispense Refill  . aspirin 81 MG tablet Take 81 mg by mouth daily.      Marland Kitchen atorvastatin (LIPITOR) 40 MG tablet Take 1 tablet (40 mg total) by mouth daily. 90 tablet 3  . levothyroxine (SYNTHROID) 150 MCG tablet TAKE 1 TABLET BY MOUTH  DAILY 90 tablet 3  . Multiple Vitamins-Minerals (PRESERVISION AREDS 2 PO) Take 1 tablet by mouth daily.     No current facility-administered medications for this visit.    Allergies-reviewed and updated Allergies  Allergen Reactions  . Penicillins Swelling and Other (See Comments)    Unknown Has patient had a PCN reaction causing immediate rash, facial/tongue/throat swelling, SOB or lightheadedness with hypotension:  unknown  Has patient had a PCN reaction causing severe rash involving mucus membranes or skin necrosis: unknown Has patient had a PCN reaction that required hospitalization : unknown,  Has patient had a PCN reaction occurring within the last 10 years: No Childhood allergy, swelling of arm after injection of penicillin    Social History  Social History Narrative   Married 26 years in 2015. No kids.       Retired from [employer], Electrical engineer rep      Hobbies: yardwork, music, movies, reading   Objective  Objective:  BP 110/80   Pulse 70   Temp 98.4 F (36.9 C)   Ht 5\' 10"  (1.778 m)   Wt 190 lb 6.4 oz (86.4 kg)   SpO2 96%   BMI 27.32 kg/m  Gen: NAD, resting comfortably HEENT: Mucous membranes are moist. Oropharynx normal Neck: no thyromegaly CV: RRR no murmurs rubs or gallops Lungs: CTAB no crackles, wheeze, rhonchi Abdomen:  soft/nontender/nondistended/normal bowel sounds. No rebound or guarding.  Ext: no edema Skin: warm, dry Neuro: grossly normal, moves all extremities, PERRLA    Assessment and Plan  69 y.o. male presenting for annual physical.  Health Maintenance counseling: 1. Anticipatory guidance: Patient counseled regarding regular dental exams q6 months, eye exams yearly,  avoiding smoking and second hand smoke, limiting alcohol to 2 beverages per day- glass of wine or two per week 2. Risk factor reduction:  Advised patient of need for regular exercise and diet rich and fruits and vegetables to reduce risk of heart attack and stroke. Exercise- getting more active in last few months but no gym due to pandemic. Diet-trying to eat reasonably healthy/cooks at home for most part. Doing a good job pushing away from table. Discussed 5-10 lbs down goal Wt Readings from Last 3 Encounters:  11/04/19 190 lb 6.4 oz (86.4 kg)  08/26/19 190 lb 9.6 oz (86.5 kg)  10/14/18 191 lb (86.6 kg)  3. Immunizations/screenings/ancillary studies- fully up to date Immunization History  Administered Date(s) Administered  . Fluad Quad(high Dose 65+) 02/05/2019  . Influenza Split 02/27/2011, 02/09/2012  . Influenza Whole 01/26/2009, 02/03/2010  . Influenza, High Dose Seasonal PF 02/09/2016, 01/31/2017, 02/05/2018  . Influenza,inj,Quad PF,6+ Mos 01/31/2013, 12/29/2013, 12/28/2014  . PFIZER SARS-COV-2 Vaccination 05/21/2019, 06/11/2019  . Pneumococcal Conjugate-13 06/30/2015  . Pneumococcal Polysaccharide-23 12/29/2013  . Td 06/29/2005, 08/13/2017  . Zoster 11/27/2011  . Zoster Recombinat (Shingrix) 07/02/2019, 10/14/2019  4. Prostate cancer screening- will trend PSA due to bump up in 2019- may discontinue next year if trend remains low risk  Lab Results  Component Value Date   PSA 1.09 10/14/2018   PSA 0.60 09/10/2017   PSA 6.05 (H) 08/06/2017   5. Colon cancer screening - July 2017 and will be due next year with history of  adenoma 6. Skin cancer screening- no dermatologist. We have done cryo in the past. Does have larger seborrheic keratoses on back of right calf- that flakes off at times and regrows. advised regular sunscreen use. Denies worrisome, changing, or new skin lesions.  7. Never smoker 8. STD screening - not needed as monogomous  Status of chronic or acute concerns   #hypothyroidism S: compliant On thyroid medication-levothyroxine 150 mcg  Lab Results  Component Value Date   TSH 2.03 10/14/2018   A/P: Hopefully controlled-update TSH with labs  Restless Legs S: pt states his wife has concerns about him having restless legs. Wife states every 7 seconds arm or leg moves- he has no issues as he is asleep. Tends to be more right arm and right leg A/P: Restless leg sensation though I am not sure this is classic restless leg syndrome as he is able to fall asleep.  I still think it is reasonable to check ferritin levels and if below 75 consider supplementation with iron. -consider gabapentin if  levels are normal perhaps low dose 100mg  to see if that makes a difference -Patient with history of daytime sleepiness and we have considered sleep apnea testing last year.  With him having these current symptoms we opted to refer him to pulmonology under daytime sleepiness-his sleep schedule is difficult to do at facility but I wonder if a home sleep study may be helpful-wonder if underlying sleep apnea could be contributing to symptoms  #Hyperglycemia-has had elevated A1c slightly in the past last year at 5.7.  We will update another A1c with blood work to make sure levels are stable.  We encourage regular exercise to help prevent diabetes  #Hyperlipidemia-patient is compliant with atorvastatin 40 mg.  We will update a lipid panel with labs.  Excellent control last year with LDL right at 70-hopeful for early stability.  We did discuss today that statins do slightly increase diabetes risk- prefer to avoid increasing  med Lab Results  Component Value Date   CHOL 144 10/14/2018   HDL 44.40 10/14/2018   LDLCALC 70 10/14/2018   TRIG 149.0 10/14/2018   CHOLHDL 3 10/14/2018    #Abdominal mass-possible foreign body related to prior surgery.  He reports this has been stable over the last year.  Denies pain  Recommended follow up: wants to come back fasting Future Appointments  Date Time Provider [place]  11/05/2019 10:00 AM [facility]    Lab/Order associations: wants to come back fasting   ICD-10-CM   1. Preventative health care  Z00.00 CBC with Differential/Platelet    Comprehensive metabolic panel    Lipid panel    TSH    PSA    Hemoglobin A1c    IBC + Ferritin  2. Hyperlipidemia, unspecified hyperlipidemia type  E78.5 CBC with Differential/Platelet    Comprehensive metabolic panel    Lipid panel  3. Hypothyroidism, unspecified type  E03.9 TSH  4. Hyperglycemia  R73.9 Hemoglobin A1c  5. Screening for prostate cancer  Z12.5 PSA  6. Restless legs  G25.81 IBC + Ferritin  7. Daytime sleepiness  R40.0 Ambulatory referral to Pulmonology  8. History of elevated PSA  Z87.898 PSA   Return precautions advised.  Garret Reddish, MD  .

## 2019-11-05 ENCOUNTER — Other Ambulatory Visit (INDEPENDENT_AMBULATORY_CARE_PROVIDER_SITE_OTHER): Payer: Medicare Other

## 2019-11-05 ENCOUNTER — Other Ambulatory Visit: Payer: Self-pay | Admitting: *Deleted

## 2019-11-05 DIAGNOSIS — E039 Hypothyroidism, unspecified: Secondary | ICD-10-CM

## 2019-11-05 DIAGNOSIS — Z0000 Encounter for general adult medical examination without abnormal findings: Secondary | ICD-10-CM

## 2019-11-05 DIAGNOSIS — G2581 Restless legs syndrome: Secondary | ICD-10-CM | POA: Diagnosis not present

## 2019-11-05 DIAGNOSIS — E785 Hyperlipidemia, unspecified: Secondary | ICD-10-CM | POA: Diagnosis not present

## 2019-11-05 DIAGNOSIS — R739 Hyperglycemia, unspecified: Secondary | ICD-10-CM

## 2019-11-05 DIAGNOSIS — Z125 Encounter for screening for malignant neoplasm of prostate: Secondary | ICD-10-CM | POA: Diagnosis not present

## 2019-11-05 DIAGNOSIS — Z87898 Personal history of other specified conditions: Secondary | ICD-10-CM

## 2019-11-05 LAB — LIPID PANEL
Cholesterol: 143 mg/dL (ref 0–200)
HDL: 47.1 mg/dL (ref >39.00)
LDL Cholesterol: 67 mg/dL (ref 0–99)
NonHDL: 96.31
Total CHOL/HDL Ratio: 3
Triglycerides: 146 mg/dL (ref 0.0–149.0)
VLDL: 29.2 mg/dL (ref 0.0–40.0)

## 2019-11-05 LAB — CBC WITH DIFFERENTIAL/PLATELET
Basophils Absolute: 0 10*3/uL (ref 0.0–0.1)
Basophils Relative: 0.6 % (ref 0.0–3.0)
Eosinophils Absolute: 0.2 10*3/uL (ref 0.0–0.7)
Eosinophils Relative: 3.4 % (ref 0.0–5.0)
HCT: 43.8 % (ref 39.0–52.0)
Hemoglobin: 15.2 g/dL (ref 13.0–17.0)
Lymphocytes Relative: 20.2 % (ref 12.0–46.0)
Lymphs Abs: 1 10*3/uL (ref 0.7–4.0)
MCHC: 34.8 g/dL (ref 30.0–36.0)
MCV: 97.7 fl (ref 78.0–100.0)
Monocytes Absolute: 0.4 10*3/uL (ref 0.1–1.0)
Monocytes Relative: 7.8 % (ref 3.0–12.0)
Neutro Abs: 3.4 10*3/uL (ref 1.4–7.7)
Neutrophils Relative %: 68 % (ref 43.0–77.0)
Platelets: 268 10*3/uL (ref 150.0–400.0)
RBC: 4.48 Mil/uL (ref 4.22–5.81)
RDW: 13 % (ref 11.5–15.5)
WBC: 5 10*3/uL (ref 4.0–10.5)

## 2019-11-05 LAB — COMPREHENSIVE METABOLIC PANEL
ALT: 14 U/L (ref 0–53)
AST: 14 U/L (ref 0–37)
Albumin: 4.1 g/dL (ref 3.5–5.2)
Alkaline Phosphatase: 88 U/L (ref 39–117)
BUN: 15 mg/dL (ref 6–23)
CO2: 28 mEq/L (ref 19–32)
Calcium: 8.9 mg/dL (ref 8.4–10.5)
Chloride: 101 mEq/L (ref 96–112)
Creatinine, Ser: 0.88 mg/dL (ref 0.40–1.50)
GFR: 85.31 mL/min (ref >60.00)
Glucose, Bld: 110 mg/dL — ABNORMAL HIGH (ref 70–99)
Potassium: 3.9 mEq/L (ref 3.5–5.1)
Sodium: 137 mEq/L (ref 135–145)
Total Bilirubin: 1 mg/dL (ref 0.2–1.2)
Total Protein: 6.2 g/dL (ref 6.0–8.3)

## 2019-11-05 LAB — TSH: TSH: 0.1 u[IU]/mL — ABNORMAL LOW (ref 0.35–4.50)

## 2019-11-05 LAB — IBC + FERRITIN
Ferritin: 32.2 ng/mL (ref 22.0–322.0)
Iron: 164 ug/dL (ref 42–165)
Saturation Ratios: 50.5 % — ABNORMAL HIGH (ref 20.0–50.0)
Transferrin: 232 mg/dL (ref 212.0–360.0)

## 2019-11-05 LAB — PSA: PSA: 1.42 ng/mL (ref 0.10–4.00)

## 2019-11-05 LAB — HEMOGLOBIN A1C: Hgb A1c MFr Bld: 5.7 % (ref 4.6–6.5)

## 2019-11-05 MED ORDER — LEVOTHYROXINE SODIUM 137 MCG PO TABS: 137.0000 ug | ORAL_TABLET | Freq: Every day | ORAL | 1 refills | Status: DC

## 2019-12-17 ENCOUNTER — Other Ambulatory Visit: Payer: Self-pay

## 2019-12-17 ENCOUNTER — Other Ambulatory Visit: Payer: Medicare Other

## 2019-12-17 DIAGNOSIS — E039 Hypothyroidism, unspecified: Secondary | ICD-10-CM

## 2019-12-17 LAB — TSH: TSH: 1.44 mIU/L (ref 0.40–4.50)

## 2019-12-17 NOTE — Addendum Note (Signed)
Addended by: Liliane Channel on: 12/17/2019 09:58 AM   Modules accepted: Orders

## 2019-12-23 ENCOUNTER — Encounter: Payer: Self-pay | Admitting: Pulmonary Disease

## 2019-12-23 ENCOUNTER — Ambulatory Visit (INDEPENDENT_AMBULATORY_CARE_PROVIDER_SITE_OTHER): Payer: Medicare Other | Admitting: Pulmonary Disease

## 2019-12-23 ENCOUNTER — Other Ambulatory Visit: Payer: Self-pay

## 2019-12-23 VITALS — BP 124/80 | HR 67 | Ht 70.0 in | Wt 189.6 lb

## 2019-12-23 DIAGNOSIS — R0683 Snoring: Secondary | ICD-10-CM

## 2019-12-23 MED ORDER — ROPINIROLE HCL 0.5 MG PO TABS: 0.5000 mg | ORAL_TABLET | Freq: Every day | ORAL | 3 refills | Status: DC

## 2019-12-23 NOTE — Patient Instructions (Signed)
Possible obstructive sleep apnea -We will schedule you for home sleep study  Restless legs -Start Requip-you will be receiving 0.5 mg tablets -Start 0.25 milligram at night for 2 to 3 days Then increase 0.5 mg every night for about 5 to 7 days Then may increase to 1 mg every night -Call us if this is not helping -Dose range goes as high as 4 mg at night  We will see you back in about 3 months  Call with significant concerns

## 2019-12-23 NOTE — Progress Notes (Signed)
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 aspirin 81 MG tablet Take 81 mg by mouth daily.    Marland Kitchen atorvastatin (LIPITOR) 40 MG tablet Take 1 tablet (40 mg total) by mouth daily.  . ferrous sulfate 325 (65 FE) MG tablet Take 325 mg by mouth once a week.  . levothyroxine (SYNTHROID) 137 MCG tablet Take 1 tablet (137 mcg total) by mouth daily before breakfast.  . Multiple Vitamins-Minerals (PRESERVISION AREDS 2 PO) Take 1 tablet by mouth daily.  Marland Kitchen rOPINIRole (REQUIP) 0.5 MG tablet Take 1 tablet (0.5 mg total) by mouth at bedtime.   No facility-administered encounter medications on file as of 12/23/2019.    Allergies as of 12/23/2019 - Review Complete 12/23/2019  Allergen Reaction Noted  . Penicillins Swelling and Other (See Comments) 06/19/2015    Past Medical History:  Diagnosis Date  . BACK PAIN 01/26/2009   1987    . Chronic kidney disease    kidney stone  2017  . DIVERTICULOSIS, COLON 07/29/2007  . Hyperlipidemia   . Thyroid disease    69years old, meds 2-3 years    Past Surgical History:  Procedure Laterality Date  . CHOLECYSTECTOMY  1988  . COLONOSCOPY    . [place]  . INGUINAL HERNIA REPAIR  05-31-10  . POLYPECTOMY      Family History  Problem Relation Age of Onset  . Colon cancer Maternal Uncle   . Colon cancer Other   . Breast cancer Mother        21  . Dementia Father        85  . CVA Father   . Breast cancer Maternal Grandmother   . Diabetes Maternal Grandfather   . Kidney disease Paternal Grandfather   . Esophageal cancer Neg Hx   . Rectal cancer Neg Hx   . Stomach cancer Neg Hx     Social History   Socioeconomic History  . Marital status: Married    Spouse name: Not on file  . Number of children: Not on file  . Years of education: Not on file  . Highest education level: Not on file  Occupational History  . Not on file  Tobacco Use  . Smoking status: Never Smoker  . Smokeless tobacco: Never Used  Vaping Use  . Vaping Use: Never  used  Substance and Sexual Activity  . Alcohol use: Yes    Alcohol/week: 5.0 standard drinks    Types: 5 Glasses of wine per week  . Drug use: No  . Sexual activity: Yes  Other Topics Concern  . Not on file  Social History Narrative   Married 26 years in 2015. No kids.       Retired from [employer], Electrical engineer rep      Hobbies: yardwork, music, movies, reading   Social Determinants of Radio broadcast assistant Strain:   . Difficulty of Paying Living Expenses: Not on file  Food Insecurity:   . Worried About Charity fundraiser in the Last Year: Not on file  . Ran Out of Food in the Last Year: Not on file  Transportation Needs:   . Lack of Transportation (Medical): Not on file  . Lack of Transportation (Non-Medical): Not on file  Physical Activity:   . Days of Exercise per Week: Not on file  . Minutes of Exercise per Session: Not  on file  Stress:   . Feeling of Stress : Not on file  Social Connections:   . Frequency of Communication with Friends and Family: Not on file  . Frequency of Social Gatherings with Friends and Family: Not on file  . Attends Religious Services: Not on file  . Active Member of Clubs or Organizations: Not on file  . Attends Archivist Meetings: Not on file  . Marital Status: Not on file  Intimate Partner Violence:   . Fear of Current or Ex-Partner: Not on file  . Emotionally Abused: Not on file  . Physically Abused: Not on file  . Sexually Abused: Not on file    Review of Systems  Psychiatric/Behavioral: Positive for sleep disturbance.  All other systems reviewed and are negative.   Vitals:   12/23/19 1459  BP: 124/80  Pulse: 67  SpO2: 98%     Physical Exam Constitutional:      Appearance: Normal appearance.  HENT:     Head: Normocephalic.     Nose: No congestion.     Mouth/Throat:     Mouth: Mucous membranes are moist.     Comments: Mallampati 2, crowded oropharynx Eyes:     Pupils: Pupils are equal, round, and reactive to light.  Cardiovascular:     Rate and Rhythm: Normal rate and regular rhythm.     Heart sounds: No murmur heard.  No friction rub.  Pulmonary:     Effort: Pulmonary effort is normal. No respiratory distress.     Breath sounds: Normal breath sounds. No stridor. No wheezing or rhonchi.  Musculoskeletal:     Cervical back: No rigidity or tenderness.  Neurological:     Mental Status: He is alert.  Psychiatric:        Mood and Affect: Mood normal.    Data Reviewed: Previous study not available  Lab work-not anemic  Assessment:  Moderate probability of significant obstructive sleep apnea  Pathophysiology of sleep disordered breathing discussed with patient Treatment options for sleep disordered breathing discussed with the patient  Restless legs  Hypothyroidism -He is noticing more significant symptoms of fatigue with reduction  in dose of Synthroid   Plan/Recommendations: Start Requip, gradually increase dose as tolerated we will start at 0.25 mg and increase to 1 over the course of about a week  Schedule the patient for home sleep study  We will follow-up in about 3 months  Encouraged  to call with any significant concerns   Sherrilyn Rist MD [facility] 12/23/2019, 3:31 PM  CC: Marin Olp, MD

## 2020-01-20 ENCOUNTER — Other Ambulatory Visit: Payer: Self-pay

## 2020-01-20 ENCOUNTER — Ambulatory Visit: Payer: Medicare Other

## 2020-01-20 DIAGNOSIS — R0683 Snoring: Secondary | ICD-10-CM

## 2020-01-20 DIAGNOSIS — G4733 Obstructive sleep apnea (adult) (pediatric): Secondary | ICD-10-CM | POA: Diagnosis not present

## 2020-02-04 ENCOUNTER — Telehealth: Payer: Self-pay | Admitting: Pulmonary Disease

## 2020-02-04 DIAGNOSIS — G4733 Obstructive sleep apnea (adult) (pediatric): Secondary | ICD-10-CM

## 2020-02-04 NOTE — Telephone Encounter (Signed)
Call patient  Sleep study result  Date of study: 01/20/2020  Impression: Moderate obstructive sleep apnea Mild oxygen desaturations  Recommendation:  DME referral  Recommend CPAP therapy for moderate obstructive sleep apnea  Auto titrating CPAP with pressure settings of 5-15 will be appropriate  Encourage weight loss measures  Follow-up in the office 4 to 6 weeks following initiation of treatment

## 2020-02-04 NOTE — Telephone Encounter (Signed)
Dr. Ander Slade has reviewed the home sleep test this showed moderate.   Recommendations   Treatment options are CPAP with the settings auto 5to 15.    Weight loss measures .   Advise against driving while sleepy & against medication with sedative side effects.    Make appointment for 3 months for compliance with download with Dr. Ander Slade.    Pt has been called informed of sleep study recommendations has agreed to cpap tx and order has been placed in chart.pt verbalized understanding.

## 2020-02-04 NOTE — Telephone Encounter (Signed)
Pt was left a vm to return call back to office for home sleep study results

## 2020-02-08 ENCOUNTER — Other Ambulatory Visit: Payer: Self-pay | Admitting: Family Medicine

## 2020-03-18 DIAGNOSIS — H25813 Combined forms of age-related cataract, bilateral: Secondary | ICD-10-CM | POA: Diagnosis not present

## 2020-03-22 ENCOUNTER — Ambulatory Visit: Payer: Medicare Other | Admitting: Pulmonary Disease

## 2020-04-10 ENCOUNTER — Other Ambulatory Visit: Payer: Self-pay | Admitting: Family Medicine

## 2020-08-31 ENCOUNTER — Telehealth: Payer: Self-pay | Admitting: Family Medicine

## 2020-08-31 NOTE — Telephone Encounter (Signed)
Left message for patient to call back and schedule Medicare Annual Wellness Visit (AWV) in office.   If not able to come in office, please offer to do virtually or by telephone.   Last AWV: 08/26/2019   Please schedule at anytime with Nurse Health Advisor.

## 2020-11-27 ENCOUNTER — Encounter: Payer: Self-pay | Admitting: Gastroenterology

## 2021-02-02 ENCOUNTER — Other Ambulatory Visit: Payer: Self-pay

## 2021-02-02 ENCOUNTER — Ambulatory Visit (INDEPENDENT_AMBULATORY_CARE_PROVIDER_SITE_OTHER): Payer: Medicare Other

## 2021-02-02 DIAGNOSIS — Z23 Encounter for immunization: Secondary | ICD-10-CM | POA: Diagnosis not present

## 2021-02-16 ENCOUNTER — Encounter: Payer: Medicare Other | Admitting: Family Medicine

## 2021-03-22 DIAGNOSIS — H25813 Combined forms of age-related cataract, bilateral: Secondary | ICD-10-CM | POA: Diagnosis not present

## 2021-03-28 ENCOUNTER — Other Ambulatory Visit: Payer: Self-pay | Admitting: Family Medicine

## 2021-06-06 ENCOUNTER — Encounter: Payer: Self-pay | Admitting: Family Medicine

## 2021-06-10 NOTE — Progress Notes (Addendum)
Phone [phone] In person visit   Subjective:   Carl Lopez is a 71 y.o. year old very pleasant male patient who presents for/with See problem oriented charting Chief Complaint  Patient presents with   Numbness    Pt c/o numbness in lips, fingertips, left hand, tongue and chin that started on 06/05/21.   This visit occurred during the SARS-CoV-2 public health emergency.  Safety protocols were in place, including screening questions prior to the visit, additional usage of staff PPE, and extensive cleaning of exam room while observing appropriate contact time as indicated for disinfecting solutions.   Past Medical History-  Patient Active Problem List   Diagnosis Date Noted   Hypothyroidism 07/29/2007    Priority: Medium    Hyperlipidemia 07/29/2007    Priority: Medium    Nephrolithiasis 06/30/2015    Priority: Low   Skin lesion 06/29/2014    Priority: Low   Hyperglycemia 06/29/2014    Priority: Low   Abdominal mass 01/12/2014    Priority: Low   SNORING 08/03/2008    Priority: Low   COLONIC POLYPS, HX OF 07/29/2007    Priority: Low    Medications- reviewed and updated Current Outpatient Medications  Medication Sig Dispense Refill   atorvastatin (LIPITOR) 40 MG tablet TAKE 1 TABLET BY MOUTH  DAILY 90 tablet 3   ferrous sulfate 325 (65 FE) MG tablet Take 325 mg by mouth once a week.     levothyroxine (SYNTHROID) 137 MCG tablet TAKE 1 TABLET BY MOUTH  DAILY BEFORE BREAKFAST 90 tablet 3   Multiple Vitamins-Minerals (PRESERVISION AREDS 2 PO) Take 1 tablet by mouth daily.     rOPINIRole (REQUIP) 0.5 MG tablet Take 1 tablet (0.5 mg total) by mouth at bedtime. 60 tablet 3   No current facility-administered medications for this visit.     Objective:  BP (!) 140/92    Pulse 66    Temp (!) 97.5 F (36.4 C)    Ht 5\' 10"  (1.778 m)    Wt 179 lb 12.8 oz (81.6 kg)    SpO2 99%    BMI 25.80 kg/m  Gen: NAD, resting comfortably CV: RRR no murmurs rubs or gallops Lungs: CTAB no  crackles, wheeze, rhonchi Abdomen: soft/nontender/nondistended/normal bowel sounds. No rebound or guarding.  Ext: no edema Skin: warm, dry Neuro: CN II-XII intact, sensation and reflexes normal throughout- equal gross touch and monofilament bilateral- on face only did gross touch but equal, 5/5 muscle strength in bilateral upper and lower extremities. Normal finger to nose. Normal rapid alternating movements. No pronator drift. Normal romberg. Normal gait.   EKG: sinus rhythm with rate 65, normal axis, normal intervals, no hypertrophy, no st or t wave changes     Assessment and Plan   # Numbness sensation in fingers in left hand/lips/tongue/chin S:Pt reported in MyChart message on 06/2021- "Yesterday, Sunday, I woke with a numbness in my left jaw, lips and tongue. Also my left hand finger tips.  My face felt like I had been to the dentist. The sensation, or lack of it, lasted several hours and subsided gradually.  On the previous day I was reading and experienced a pixelated effect in my right eye that lasted several minutes and then disappeared quickly. It has not reoccurred.   As I write I feel perfectly fine but wonder, do I need to be checked out?"  Today he reports it was all 5 fingertips  and into forearm- no issues down into legs. In regards to  the pixelated effect in right eye- if closed left eye would see it but if closed right eye didn't see at all- lasted for a few minutes perhaps 15 mins total.   Day before yesterday woke up and felt slight numbness in fingertips but resolved quickly. Doesn't feel tingling anymore but has a sensation of lips not being as sensitive but all the way across. No history of seizures or migraines.   Also did have flu back in January - continual dry cough but gradually improving- finally expelling things and feels better.   ROS- No facial or extremity weakness. No slurred words or trouble swallowing. no  double vision. No confusion or word finding  difficulties. No chest pain or shortness of breath  A/P: 71 year old male with history of well treated hyperlipidemia presenting with left-sided hemianesthesia for several hours about a week ago with no clear recurrence other than some numbness in his hands 1 morning when waking up couple days ago- also reassuring neurological exam.  Possible TIA-doubt stroke with no lingering neurological deficits on exam-she does have some vague bilateral change in sensation in his lips as well as left upper chest numbness. - We will update blood work -Ekg reassuring- no obvious a fib- declines monitoring for now unless recurrence or neurology suggests -doubt seizure or complex migraine- but can still get neurology insight - MRI of the brain ordered stat - Carotid ultrasound ordered - Neurology referral ordered -Restart aspirin 81 mg in case this was a TIA -bp slightly high today but traditionally controlled- will monitor at home- if did in fact have stroke (doubt) would lower BP   #hyperlipidemia S: Medication:atorvastatin 40 mg daily - used to takes aspirin 81mg  for primary prevention- never had a heart attack or stroke Lab Results  Component Value Date   CHOL 143 11/05/2019   HDL 47.10 11/05/2019   LDLCALC 67 11/05/2019   TRIG 146.0 11/05/2019   CHOLHDL 3 11/05/2019   A/P: cholesterol has been well controlled- continue current meds- reasonable to check at least LDL early- ordered today    #hypothyroidism S: compliant On thyroid medication-levothyroxine 137 mcg  Lab Results  Component Value Date   TSH 1.44 12/17/2019   A/P:with tingling sensations- reasonable to check TSH though I do not suspect that is the cause.    #elevated BP reading S: medication: none Home readings #s: has cuff BP Readings from Last 3 Encounters:  06/13/21 (!) 140/92  12/23/19 124/80  11/04/19 110/80  A/P: blood pressure slightly high- want him to monitor at home daily for next week when he is relaxed and update  me  # Hyperglycemia/insulin resistance/prediabetes S:  Medication: none Exercise and diet- not exercising much lately- has a chair that he just loves to sit in got recently. Weight down 11 lbs - cut out wheat and has found this helpful Lab Results  Component Value Date   HGBA1C 5.7 11/05/2019   HGBA1C 5.7 10/14/2018   HGBA1C 5.5 08/06/2017   A/P: will check a1c with labs today  Recommended follow up: Return for next already scheduled visit or sooner if needed. Future Appointments  Date Time Provider [place]  08/03/2021 10:40 AM Marin Olp, MD [facility]    Lab/Order associations:   ICD-10-CM   1. Numbness and tingling sensation of skin  R20.0 MR Brain W Wo Contrast   R20.2 US Carotid Duplex Bilateral    Ambulatory referral to Neurology    2. Hypothyroidism, unspecified type  E03.9 TSH  3. Hyperglycemia  R73.9 Hemoglobin A1c    4. Hyperlipidemia, unspecified hyperlipidemia type  E78.5 LDL cholesterol, direct    Comprehensive metabolic panel    CBC with Differential/Platelet    5. Hemianesthesia  R20.0 MR Brain W Wo Contrast    US Carotid Duplex Bilateral    Ambulatory referral to Neurology     Time Spent: 41 minutes of total time (2: 50 PM- 3:31 PM) was spent on the date of the encounter performing the following actions: chart review prior to seeing the patient, obtaining history, performing a medically necessary exam, counseling on the treatment plan, placing orders, and documenting in our EHR.   I,Jada Bradford,acting as a scribe for Garret Reddish, MD.,have documented all relevant documentation on the behalf of Garret Reddish, MD,as directed by  Garret Reddish, MD while in the presence of Garret Reddish, MD.  I, Garret Reddish, MD, have reviewed all documentation for this visit. The documentation on 06/13/21 for the exam, diagnosis, procedures, and orders are all accurate and complete.   Return precautions advised.  Garret Reddish, MD

## 2021-06-13 ENCOUNTER — Ambulatory Visit (HOSPITAL_COMMUNITY)
Admission: RE | Admit: 2021-06-13 | Discharge: 2021-06-13 | Disposition: A | Discharge status: Home / Self Care | Payer: Medicare Other | Source: Ambulatory Visit | Attending: Family Medicine | Admitting: Family Medicine

## 2021-06-13 ENCOUNTER — Encounter: Payer: Self-pay | Admitting: Family Medicine

## 2021-06-13 ENCOUNTER — Telehealth: Payer: Self-pay

## 2021-06-13 ENCOUNTER — Other Ambulatory Visit: Payer: Self-pay

## 2021-06-13 ENCOUNTER — Ambulatory Visit (INDEPENDENT_AMBULATORY_CARE_PROVIDER_SITE_OTHER): Payer: Medicare Other | Admitting: Family Medicine

## 2021-06-13 VITALS — BP 140/92 | HR 66 | Temp 97.5°F | Ht 70.0 in | Wt 179.8 lb

## 2021-06-13 DIAGNOSIS — R200 Anesthesia of skin: Secondary | ICD-10-CM | POA: Insufficient documentation

## 2021-06-13 DIAGNOSIS — E785 Hyperlipidemia, unspecified: Secondary | ICD-10-CM | POA: Diagnosis not present

## 2021-06-13 DIAGNOSIS — R202 Paresthesia of skin: Secondary | ICD-10-CM | POA: Diagnosis not present

## 2021-06-13 DIAGNOSIS — R030 Elevated blood-pressure reading, without diagnosis of hypertension: Secondary | ICD-10-CM

## 2021-06-13 DIAGNOSIS — R739 Hyperglycemia, unspecified: Secondary | ICD-10-CM

## 2021-06-13 DIAGNOSIS — E039 Hypothyroidism, unspecified: Secondary | ICD-10-CM | POA: Diagnosis not present

## 2021-06-13 MED ORDER — GADOBUTROL 1 MMOL/ML IV SOLN
9.0000 mL | Freq: Once | INTRAVENOUS | Status: AC | PRN
  Administered 2021-06-13: 9 mL via INTRAVENOUS

## 2021-06-13 NOTE — Telephone Encounter (Signed)
Left patient vm to call back to confirm MRI appt with [facility] for tonight.  Number to reschedule is [phone].

## 2021-06-13 NOTE — Addendum Note (Signed)
Addended by: Clyde Lundborg A on: 06/13/2021 03:37 PM   Modules accepted: Orders

## 2021-06-13 NOTE — Patient Instructions (Addendum)
blood pressure slightly high- want him to monitor at home daily for next week when he is relaxed and update me  We will call you within two weeks about your referral to referral to neurology and carotid ultrasound. If you do not hear within 2-3 weeks, give [place] a call.   You should be contacted sooner about MRI which was ordered stat   Restart aspirin 81 mg in case this was a TIA  Team do EKG under elevated blood pressure and hyperlipidemia  Please stop by lab before you go If you have mychart- we will send your results within 3 business days of [place] receiving them.  If you do not have mychart- we will call you about results within 5 business days of [place] receiving them.  *please also note that you will see labs on mychart as soon as they post. I will later go in and write notes on them- will say "notes from Dr. Yong Channel"   Recommended follow up: Return for next already scheduled visit or sooner if needed.

## 2021-06-14 LAB — CBC WITH DIFFERENTIAL/PLATELET
Basophils Absolute: 0 10*3/uL (ref 0.0–0.1)
Basophils Relative: 0.7 % (ref 0.0–3.0)
Eosinophils Absolute: 0.1 10*3/uL (ref 0.0–0.7)
Eosinophils Relative: 1.9 % (ref 0.0–5.0)
HCT: 45 % (ref 39.0–52.0)
Hemoglobin: 15.2 g/dL (ref 13.0–17.0)
Lymphocytes Relative: 12.7 % (ref 12.0–46.0)
Lymphs Abs: 0.9 10*3/uL (ref 0.7–4.0)
MCHC: 33.9 g/dL (ref 30.0–36.0)
MCV: 103.2 fl — ABNORMAL HIGH (ref 78.0–100.0)
Monocytes Absolute: 0.5 10*3/uL (ref 0.1–1.0)
Monocytes Relative: 6.3 % (ref 3.0–12.0)
Neutro Abs: 5.7 10*3/uL (ref 1.4–7.7)
Neutrophils Relative %: 78.4 % — ABNORMAL HIGH (ref 43.0–77.0)
Platelets: 254 10*3/uL (ref 150.0–400.0)
RBC: 4.36 Mil/uL (ref 4.22–5.81)
RDW: 14.8 % (ref 11.5–15.5)
WBC: 7.2 10*3/uL (ref 4.0–10.5)

## 2021-06-14 LAB — TSH: TSH: 0.66 u[IU]/mL (ref 0.35–5.50)

## 2021-06-14 LAB — HEMOGLOBIN A1C: Hgb A1c MFr Bld: 5.6 % (ref 4.6–6.5)

## 2021-06-15 ENCOUNTER — Ambulatory Visit
Admission: RE | Admit: 2021-06-15 | Discharge: 2021-06-15 | Disposition: A | Discharge status: Home / Self Care | Payer: Medicare Other | Source: Ambulatory Visit | Attending: Family Medicine | Admitting: Family Medicine

## 2021-06-15 DIAGNOSIS — R200 Anesthesia of skin: Secondary | ICD-10-CM

## 2021-06-15 DIAGNOSIS — R202 Paresthesia of skin: Secondary | ICD-10-CM | POA: Diagnosis not present

## 2021-06-15 LAB — COMPREHENSIVE METABOLIC PANEL
ALT: 12 U/L (ref 0–53)
AST: 15 U/L (ref 0–37)
Albumin: 4.4 g/dL (ref 3.5–5.2)
Alkaline Phosphatase: 85 U/L (ref 39–117)
BUN: 13 mg/dL (ref 6–23)
CO2: 31 mEq/L (ref 19–32)
Calcium: 9.7 mg/dL (ref 8.4–10.5)
Chloride: 102 mEq/L (ref 96–112)
Creatinine, Ser: 1.01 mg/dL (ref 0.40–1.50)
GFR: 74.13 mL/min (ref >60.00)
Glucose, Bld: 93 mg/dL (ref 70–99)
Potassium: 4.3 mEq/L (ref 3.5–5.1)
Sodium: 141 mEq/L (ref 135–145)
Total Bilirubin: 0.9 mg/dL (ref 0.2–1.2)
Total Protein: 6.8 g/dL (ref 6.0–8.3)

## 2021-06-15 LAB — LDL CHOLESTEROL, DIRECT: Direct LDL: 82 mg/dL

## 2021-06-17 ENCOUNTER — Encounter: Payer: Self-pay | Admitting: Neurology

## 2021-06-24 ENCOUNTER — Encounter: Payer: Self-pay | Admitting: Family Medicine

## 2021-07-06 NOTE — Progress Notes (Incomplete)
Phone: [phone]   Subjective:  Patient presents today for their annual physical. Chief complaint-noted.   See problem oriented charting- ROS- full  review of systems was completed and negative  except for: ***  The following were reviewed and entered/updated in epic: Past Medical History:  Diagnosis Date   BACK PAIN 01/26/2009   1987     Chronic kidney disease    kidney stone 2017   DIVERTICULOSIS, COLON 07/29/2007   Hyperlipidemia    Thyroid disease    71years old, meds 2-3 years   Patient Active Problem List   Diagnosis Date Noted   Nephrolithiasis 06/30/2015   Skin lesion 06/29/2014   Hyperglycemia 06/29/2014   Abdominal mass 01/12/2014   SNORING 08/03/2008   Hypothyroidism 07/29/2007   Hyperlipidemia 07/29/2007   COLONIC POLYPS, HX OF 07/29/2007   Past Surgical History:  Procedure Laterality Date   CHOLECYSTECTOMY  1988   COLONOSCOPY     [place]  05-31-10   POLYPECTOMY      Family History  Problem Relation Age of Onset   Colon cancer Maternal Uncle    Colon cancer Other    Breast cancer Mother        81   Dementia Father        76   CVA Father    Breast cancer Maternal Grandmother    Diabetes Maternal Grandfather    Kidney disease Paternal Grandfather    Esophageal cancer Neg Hx    Rectal cancer Neg Hx    Stomach cancer Neg Hx     Medications- reviewed and updated Current Outpatient Medications  Medication Sig Dispense Refill   atorvastatin (LIPITOR) 40 MG tablet TAKE 1 TABLET BY MOUTH  DAILY 90 tablet 3   ferrous sulfate 325 (65 FE) MG tablet Take 325 mg by mouth once a week.     levothyroxine (SYNTHROID) 137 MCG tablet TAKE 1 TABLET BY MOUTH  DAILY BEFORE BREAKFAST 90 tablet 3   Multiple Vitamins-Minerals (PRESERVISION AREDS 2 PO) Take 1 tablet by mouth daily.     No current facility-administered medications for this visit.    Allergies-reviewed and updated Allergies  Allergen Reactions    Penicillins Swelling and Other (See Comments)    Unknown Has patient had a PCN reaction causing immediate rash, facial/tongue/throat swelling, SOB or lightheadedness with hypotension:  unknown  Has patient had a PCN reaction causing severe rash involving mucus membranes or skin necrosis: unknown Has patient had a PCN reaction that required hospitalization : unknown,  Has patient had a PCN reaction occurring within the last 10 years: No Childhood allergy, swelling of arm after injection of penicillin    Social History   Social History Narrative   Married 26 years in 2015. No kids.       Retired from [employer], Electrical engineer rep      Hobbies: yardwork, music, movies, reading   Objective  Objective:  There were no vitals taken for this visit. Gen: NAD, resting comfortably HEENT: Mucous membranes are moist. Oropharynx normal Neck: no thyromegaly CV: RRR no murmurs rubs or gallops Lungs: CTAB no crackles, wheeze, rhonchi Abdomen: soft/nontender/nondistended/normal bowel sounds. No rebound or guarding.  Ext: no edema Skin: warm, dry Neuro: grossly normal, moves all extremities, PERRLA ***   Assessment and Plan  71 y.o. male presenting for annual physical.  Health Maintenance counseling: 1. Anticipatory guidance: Patient counseled regarding regular dental exams ***q6 months, eye exams ***yearly,  avoiding  smoking and second hand smoke*** , limiting alcohol to 2 beverages per day - glass of wine or two per week***.  No illicit drugs - *** 2. Risk factor reduction:  Advised patient of need for regular exercise and diet rich and fruits and vegetables to reduce risk of heart attack and stroke.  Exercise- getting more active in last few months but no gym due to pandemic***.  Diet/weight management-trying to eat reasonably healthy/cooks at home for most part. Doing a good job pushing away from table. Discussed 5-10 lbs down goal***.  Wt Readings from Last 3 Encounters:   06/13/21 179 lb 12.8 oz (81.6 kg)  12/23/19 189 lb 9.6 oz (86 kg)  11/04/19 190 lb 6.4 oz (86.4 kg)   3. Immunizations/screenings/ancillary studies Immunization History  Administered Date(s) Administered   Fluad Quad(high Dose 65+) 02/05/2019, 02/02/2021   Influenza Split 02/27/2011, 02/09/2012   Influenza Whole 01/26/2009, 02/03/2010   Influenza, High Dose Seasonal PF 02/09/2016, 01/31/2017, 02/05/2018   Influenza,inj,Quad PF,6+ Mos 01/31/2013, 12/29/2013, 12/28/2014   PFIZER(Purple Top)SARS-COV-2 Vaccination 05/21/2019, 06/11/2019, 01/28/2020, 09/28/2020   Pfizer Covid-19 Vaccine Bivalent Booster 44yr & up 03/01/2021   Pneumococcal Conjugate-13 06/30/2015   Pneumococcal Polysaccharide-23 12/29/2013   Td 06/29/2005, 08/13/2017   Zoster Recombinat (Shingrix) 07/02/2019, 10/14/2019   Zoster, Live 11/27/2011   Health Maintenance Due  Topic Date Due   COLONOSCOPY (Pts 45-424yrInsurance coverage will need to be confirmed)  11/21/2020   4. Prostate cancer screening- will trend PSA due to bump up in 2019- may discontinue next year if trend remains low risk ***  Lab Results  Component Value Date   PSA 1.42 11/05/2019   PSA 1.09 10/14/2018   PSA 0.60 09/10/2017   5. Colon cancer screening - 11/22/15 with 5 year repeat planned-history of adenoma 6. Skin cancer screening- no dermatologist. We have done cryo in the past. Does have larger seborrheic keratoses on back of right calf- that flakes off at times and regrows***advised regular sunscreen use. Denies worrisome, changing, or new skin lesions.  7. Smoking associated screening (lung cancer screening, AAA screen 65-75, UA)- never smoker- *** 8. STD screening - not needed as monogomous***  Status of chronic or acute concerns   cpe 11/04/19 *** Medicare AWVS: 08/26/2019 ***  103.2 mcv in feb 2023- work up ***   # Numbness sensation in fingers in left hand/lips/tongue/chin S:Pt reported in [facility] on 06/2021- "Yesterday, Sunday,  I woke with a numbness in my left jaw, lips and tongue. Also my left hand finger tips.  My face felt like I had been to the dentist. The sensation, or lack of it, lasted several hours and subsided gradually.  On the previous day I was reading and experienced a pixelated effect in my right eye that lasted several minutes and then disappeared quickly. It has not reoccurred.   As I write I feel perfectly fine but wonder, do I need to be checked out?"   2/23 visit, he reported it was all 5 fingertips and into forearm- no issues down into legs. In regards to the pixelated effect in right eye- if closed left eye would see it but if closed right eye didn't see at all- lasted for a few minutes perhaps 15 mins total.    Also did have flu back in January - continual dry cough but gradually improving- finally expelling things and feels better.     Possible TIA-doubt stroke with no lingering neurological deficits on exam-she does have some vague bilateral change in  sensation in his lips as well as left upper chest numbness. -Ekg reassuring- no obvious a fib- declines monitoring for now unless recurrence or neurology suggests -doubt seizure or complex migraine- but can still get neurology insight - also MRI was ordered and carotid ultrasound A/P: ***   #hyperlipidemia S: Medication:atorvastatin 40 mg daily - used to takes aspirin '81mg'$  for primary prevention- never had a heart attack or stroke Lab Results  Component Value Date   CHOL 143 11/05/2019   HDL 47.10 11/05/2019   LDLCALC 67 11/05/2019   LDLDIRECT 82.0 06/13/2021   TRIG 146.0 11/05/2019   CHOLHDL 3 11/05/2019   A/P: ***   #hypothyroidism S: compliant On thyroid medication-levothyroxine 137 mcg  Lab Results  Component Value Date   TSH 0.66 06/13/2021    A/P:***    #elevated BP reading S: medication: none Home readings #s: *** BP Readings from Last 3 Encounters:  06/13/21 (!) 140/92  12/23/19 124/80  11/04/19 110/80  A/P:  ***   # Hyperglycemia/insulin resistance/prediabetes S:  Medication: none Exercise and diet- *** Lab Results  Component Value Date   HGBA1C 5.6 06/13/2021   HGBA1C 5.7 11/05/2019   HGBA1C 5.7 10/14/2018    A/P: ***  Recommended follow up: No follow-ups on file. Future Appointments  Date Time Provider [place]  08/03/2021 10:40 AM Marin Olp, MD [facility]  08/08/2021  2:50 PM Narda Amber K, DO [facility] None    No chief complaint on file.  Lab/Order associations:*** fasting No diagnosis found.  No orders of the defined types were placed in this encounter.   I,Jada Bradford,acting as a scribe for Garret Reddish, MD.,have documented all relevant documentation on the behalf of Garret Reddish, MD,as directed by  Garret Reddish, MD while in the presence of Garret Reddish, MD.  *** Return precautions advised.  Burnett Corrente

## 2021-08-03 ENCOUNTER — Encounter: Payer: Self-pay | Admitting: Family Medicine

## 2021-08-03 ENCOUNTER — Ambulatory Visit (INDEPENDENT_AMBULATORY_CARE_PROVIDER_SITE_OTHER): Payer: Medicare Other | Admitting: Family Medicine

## 2021-08-03 VITALS — BP 138/76 | HR 58 | Temp 98.2°F | Ht 70.0 in | Wt 178.4 lb

## 2021-08-03 DIAGNOSIS — R351 Nocturia: Secondary | ICD-10-CM

## 2021-08-03 DIAGNOSIS — R739 Hyperglycemia, unspecified: Secondary | ICD-10-CM | POA: Diagnosis not present

## 2021-08-03 DIAGNOSIS — E538 Deficiency of other specified B group vitamins: Secondary | ICD-10-CM | POA: Insufficient documentation

## 2021-08-03 DIAGNOSIS — Z0000 Encounter for general adult medical examination without abnormal findings: Secondary | ICD-10-CM | POA: Diagnosis not present

## 2021-08-03 DIAGNOSIS — Z1283 Encounter for screening for malignant neoplasm of skin: Secondary | ICD-10-CM

## 2021-08-03 DIAGNOSIS — E039 Hypothyroidism, unspecified: Secondary | ICD-10-CM | POA: Diagnosis not present

## 2021-08-03 DIAGNOSIS — D7589 Other specified diseases of blood and blood-forming organs: Secondary | ICD-10-CM | POA: Diagnosis not present

## 2021-08-03 DIAGNOSIS — R030 Elevated blood-pressure reading, without diagnosis of hypertension: Secondary | ICD-10-CM

## 2021-08-03 DIAGNOSIS — E785 Hyperlipidemia, unspecified: Secondary | ICD-10-CM | POA: Diagnosis not present

## 2021-08-03 LAB — CBC WITH DIFFERENTIAL/PLATELET
Basophils Absolute: 0 10*3/uL (ref 0.0–0.1)
Basophils Relative: 0.4 % (ref 0.0–3.0)
Eosinophils Absolute: 0 10*3/uL (ref 0.0–0.7)
Eosinophils Relative: 0.9 % (ref 0.0–5.0)
HCT: 42.6 % (ref 39.0–52.0)
Hemoglobin: 14.6 g/dL (ref 13.0–17.0)
Lymphocytes Relative: 17.3 % (ref 12.0–46.0)
Lymphs Abs: 0.9 10*3/uL (ref 0.7–4.0)
MCHC: 34.3 g/dL (ref 30.0–36.0)
MCV: 104.9 fl — ABNORMAL HIGH (ref 78.0–100.0)
Monocytes Absolute: 0.4 10*3/uL (ref 0.1–1.0)
Monocytes Relative: 7 % (ref 3.0–12.0)
Neutro Abs: 3.9 10*3/uL (ref 1.4–7.7)
Neutrophils Relative %: 74.4 % (ref 43.0–77.0)
Platelets: 262 10*3/uL (ref 150.0–400.0)
RBC: 4.06 Mil/uL — ABNORMAL LOW (ref 4.22–5.81)
RDW: 14.6 % (ref 11.5–15.5)
WBC: 5.3 10*3/uL (ref 4.0–10.5)

## 2021-08-03 LAB — TSH: TSH: 0.48 u[IU]/mL (ref 0.35–5.50)

## 2021-08-03 LAB — COMPREHENSIVE METABOLIC PANEL
ALT: 10 U/L (ref 0–53)
AST: 16 U/L (ref 0–37)
Albumin: 4.3 g/dL (ref 3.5–5.2)
Alkaline Phosphatase: 81 U/L (ref 39–117)
BUN: 18 mg/dL (ref 6–23)
CO2: 29 mEq/L (ref 19–32)
Calcium: 9.1 mg/dL (ref 8.4–10.5)
Chloride: 105 mEq/L (ref 96–112)
Creatinine, Ser: 0.88 mg/dL (ref 0.40–1.50)
GFR: 85.63 mL/min (ref >60.00)
Glucose, Bld: 107 mg/dL — ABNORMAL HIGH (ref 70–99)
Potassium: 4.6 mEq/L (ref 3.5–5.1)
Sodium: 140 mEq/L (ref 135–145)
Total Bilirubin: 1 mg/dL (ref 0.2–1.2)
Total Protein: 6.3 g/dL (ref 6.0–8.3)

## 2021-08-03 LAB — LIPID PANEL
Cholesterol: 133 mg/dL (ref 0–200)
HDL: 55.4 mg/dL (ref >39.00)
LDL Cholesterol: 64 mg/dL (ref 0–99)
NonHDL: 77.81
Total CHOL/HDL Ratio: 2
Triglycerides: 68 mg/dL (ref 0.0–149.0)
VLDL: 13.6 mg/dL (ref 0.0–40.0)

## 2021-08-03 LAB — VITAMIN B12: Vitamin B-12: 52 pg/mL — ABNORMAL LOW (ref 211–911)

## 2021-08-03 LAB — PSA: PSA: 0.66 ng/mL (ref 0.10–4.00)

## 2021-08-03 NOTE — Patient Instructions (Addendum)
Please stop by lab before you go ?If you have mychart- we will send your results within 3 business days of [place] receiving them.  ?If you do not have mychart- we will call you about results within 5 business days of [place] receiving them.  ?*please also note that you will see labs on mychart as soon as they post. I will later go in and write notes on them- will say "notes from Dr. Yong Channel"  ? ?We will call you within two weeks about your referral to dermatology for general skin cancer check but also please mention the scalp issue. If you do not hear within 2 weeks, give [place] a call.   ? ?Schedule dentist follow up ? ?Recommended follow up: Return in about 1 year (around 08/04/2022) for physical or sooner if needed.Schedule b4 you leave. ?

## 2021-08-04 ENCOUNTER — Encounter: Payer: Self-pay | Admitting: Family Medicine

## 2021-08-04 ENCOUNTER — Other Ambulatory Visit: Payer: Self-pay

## 2021-08-04 MED ORDER — CYANOCOBALAMIN 1000 MCG/ML IJ SOLN: INTRAMUSCULAR | 3 refills | Status: DC

## 2021-08-05 LAB — FOLATE RBC: RBC Folate: 513 ng/mL RBC (ref >280)

## 2021-08-08 ENCOUNTER — Encounter: Payer: Self-pay | Admitting: Neurology

## 2021-08-08 ENCOUNTER — Ambulatory Visit: Payer: Medicare Other | Admitting: Neurology

## 2021-08-08 VITALS — BP 131/85 | HR 84 | Ht 70.0 in | Wt 179.0 lb

## 2021-08-08 DIAGNOSIS — R202 Paresthesia of skin: Secondary | ICD-10-CM

## 2021-08-08 DIAGNOSIS — E538 Deficiency of other specified B group vitamins: Secondary | ICD-10-CM

## 2021-08-08 NOTE — Progress Notes (Signed)
?[facility] ?Neurology Division ?Clinic Note - Initial Visit ? ? ?Date: 08/08/21 ? ?Carl Lopez ?MRN: [id number] ?DOB: [birth year] ? ? ?Dear Dr. Yong Channel: ? ?Thank you for your kind referral of Carl Lopez for consultation of left facial and hand numbness. Although his history is well known to you, please allow [place] to reiterate it for the purpose of our medical record. The patient was accompanied to the clinic by self. ? ?History of Present Illness: ?Carl Lopez is a 71 y.o. right-handed male with CKD and hypothyroidism presenting for evaluation of left face and hand tingling.  ? ?About 8 weeks ago, he woke up with left facial numbness/tingling over the left jaw, tongue, and left fingers which lasted 3-4 hours and resolved.  Symptoms have not recurred since this time.  He denies weakness of the face, arm, or leg.  No problems with speech or language. No associated headache.  He had MRI brain and US carotids which was normal.  No prior history of heart disease or TIA/stroke.  He has started aspirin '81mg'$  daily.  No history of diabetes or hypertension.  ? ?His B12 was found to be very low (52) and he started injections.  He is not vegan/vegetarian.  ? ?  ? ?Out-side paper records, electronic medical record, and images have been reviewed where available and summarized as:  ?US carotids 06/15/2021:  No significant stenosis of internal carotid arteries. ? ?MRI brain wwo contrast 06/14/2021:  No significant stenosis of internal carotid arteries. ? ?Lab Results  ?Component Value Date  ? HGBA1C 5.6 06/13/2021  ? ?Lab Results  ?Component Value Date  ? VITAMINB12 52 (L) 08/03/2021  ? ?Lab Results  ?Component Value Date  ? TSH 0.48 08/03/2021  ? ?Lab Results  ?Component Value Date  ? CHOL 133 08/03/2021  ? HDL 55.40 08/03/2021  ? [place] 64 08/03/2021  ? LDLDIRECT 82.0 06/13/2021  ? TRIG 68.0 08/03/2021  ? CHOLHDL 2 08/03/2021  ? ? ? ?Past Medical History:  ?Diagnosis Date  ? BACK PAIN 01/26/2009  ? 1987    ? Chronic  kidney disease   ? kidney stone 2017  ? DIVERTICULOSIS, COLON 07/29/2007  ? Hyperlipidemia   ? Thyroid disease   ? 71years old, meds 2-3 years  ? ? ?Past Surgical History:  ?Procedure Laterality Date  ? CHOLECYSTECTOMY  1988  ? COLONOSCOPY    ? [place]  ? INGUINAL HERNIA REPAIR  05-31-10  ? POLYPECTOMY    ? ? ? ?Medications:  ?Outpatient Encounter Medications as of 08/08/2021  ?Medication Sig  ? aspirin EC 81 MG tablet Take 81 mg by mouth daily. Swallow whole.  ? atorvastatin (LIPITOR) 40 MG tablet TAKE 1 TABLET BY MOUTH  DAILY  ? cyanocobalamin (,VITAMIN B-12,) 1000 MCG/ML injection 1000 mcg (1 mg) injection once per week for four weeks, followed by 1000 mcg injection once per month.  ? ferrous sulfate 325 (65 FE) MG tablet Take 325 mg by mouth once a week.  ? levothyroxine (SYNTHROID) 137 MCG tablet TAKE 1 TABLET BY MOUTH  DAILY BEFORE BREAKFAST  ? Multiple Vitamins-Minerals (PRESERVISION AREDS 2 PO) Take 1 tablet by mouth daily.  ? ?No facility-administered encounter medications on file as of 08/08/2021.  ? ? ?Allergies:  ?Allergies  ?Allergen Reactions  ? Penicillins Swelling and Other (See Comments)  ?  Unknown ?Has patient had a PCN reaction causing immediate rash, facial/tongue/throat swelling, SOB or lightheadedness with hypotension:  unknown  ?Has patient had a  PCN reaction causing severe rash involving mucus membranes or skin necrosis: unknown ?Has patient had a PCN reaction that required hospitalization : unknown,  ?Has patient had a PCN reaction occurring within the last 10 years: No ?Childhood allergy, swelling of arm after injection of penicillin  ? ? ?Family History: ?Family History  ?Problem Relation Age of Onset  ? Colon cancer Maternal Uncle   ? Colon cancer Other   ? Breast cancer Mother   ?     60  ? Dementia Father   ?     36  ? CVA Father   ? Breast cancer Maternal Grandmother   ? Diabetes Maternal Grandfather   ? Kidney disease Paternal Grandfather   ? Esophageal cancer Neg  Hx   ? Rectal cancer Neg Hx   ? Stomach cancer Neg Hx   ? ? ?Social History: ?Social History  ? ?Tobacco Use  ? Smoking status: Never  ? Smokeless tobacco: Never  ?Vaping Use  ? Vaping Use: Never used  ?Substance Use Topics  ? Alcohol use: Not Currently  ?  Alcohol/week: 2.0 - 4.0 standard drinks  ?  Types: 2 - 4 Glasses of wine per week  ? Drug use: No  ? ?Social History  ? ?Social History Narrative  ? Married 26 years in 2015. No kids. 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   ? Right Handed   ? Lives in a one story home   ? ? ?Vital Signs:  ?BP 131/85   Pulse 84   Ht '5\' 10"'$  (1.778 m)   Wt 179 lb (81.2 kg)   SpO2 98%   BMI 25.68 kg/m?  ?  ? ?Neurological Exam: ?MENTAL STATUS including orientation to time, place, person, recent and remote memory, attention span and concentration, language, and fund of knowledge is normal.  Speech is not dysarthric. ? ?CRANIAL NERVES: ?II:  No visual field defects. ?III-IV-VI: Pupils equal round and reactive to light.  Normal conjugate, extra-ocular eye movements in all directions of gaze.  No nystagmus.  No ptosis.   ?V:  Normal facial sensation.    ?VII:  Normal facial symmetry and movements.   ?VIII:  Normal hearing and vestibular function.   ?IX-X:  Normal palatal movement.   ?XI:  Normal shoulder shrug and head rotation.   ?XII:  Normal tongue strength and range of motion, no deviation or fasciculation. ? ?MOTOR:  No atrophy, fasciculations or abnormal movements.  No pronator drift.  ? ?Upper Extremity:  Right  Left  ?Deltoid  5/5   5/5   ?Biceps  5/5   5/5   ?Triceps  5/5   5/5   ?Wrist extensors  5/5   5/5   ?Wrist flexors  5/5   5/5   ?Finger extensors  5/5   5/5   ?Finger flexors  5/5   5/5   ?Dorsal interossei  5/5   5/5   ?Abductor pollicis  5/5   5/5   ?Tone (Ashworth scale)  0  0  ? ?Lower Extremity:  Right  Left  ?Hip flexors  5/5   5/5   ?Knee extensors  5/5   5/5   ?Dorsiflexors  5/5   5/5   ?Plantarflexors   5/5   5/5   ?Toe extensors  5/5   5/5   ?Toe flexors  5/5   5/5   ?Tone (Ashworth scale)  0  0  ? ?MSRs:  ?Right  Left                  ?brachioradialis 2+  2+  ?biceps 2+  2+  ?triceps 2+  2+  ?patellar 2+  2+  ?ankle jerk 2+  2+  ?Hoffman no  no  ?plantar response down  down  ? ?SENSORY:  Normal and symmetric perception of light touch, pinprick, vibration, and proprioception.   ? ?COORDINATION/GAIT: Normal finger-to- nose-finger and heel-to-shin.  Intact rapid alternating movements bilaterally.   Gait narrow based and stable. Tandem and stressed gait intact.  ? ? ?IMPRESSION: ?Left face and hand paresthesias.  ?TIA vs symptoms related to B12 deficiency.  Prior work-up has been extensive and involves MRI brain wwo contrast which was normal and US carotids which shows no carotid stenosis. He has no vascular risk factors.  His vitamin B12 level is very low (50) and he is on injections for this.  Generally, paresthesias due to B12 deficency tend to last much longer in duration and improve with supplementation, however, patient's symptoms were very transient and hemisensory which raises concern for TIA.  He is on aspirin and cholesterol is well-controlled (LDL 64).  If he has any further spells, then I recommend extended cardiac monitoring.  Fortunately, he is no longer symptomatic and doing well.   ? ?Return to clinic if new symptoms arise ? ? ?Thank you for allowing me to participate in patient's care.  If I can answer any additional questions, I would be pleased to do so.   ? ?Sincerely, ? ? ? ?Josmar Messimer K. Posey Pronto, DO ? ?

## 2021-08-08 NOTE — Patient Instructions (Signed)
It was great to see you today. ? ?Continue your vitamin B12 injections ? ?If your symptoms return, please call my office ? ? ?

## 2021-08-10 ENCOUNTER — Telehealth: Payer: Self-pay | Admitting: Physician Assistant

## 2021-08-10 NOTE — Telephone Encounter (Signed)
Patient is calling for a referral appointment from Garret Reddish, M.D.  Patient is scheduled for 03/30/2022 at 2:00 with [facility], PA-C. ?

## 2021-08-10 NOTE — Telephone Encounter (Signed)
Referral attached to appointment

## 2021-09-15 ENCOUNTER — Telehealth: Payer: Self-pay | Admitting: Family Medicine

## 2021-09-15 NOTE — Telephone Encounter (Signed)
Copied from [place] [id number]. Topic: Medicare AWV >> Sep 15, 2021  9:24 AM Harris-Coley, Hannah Beat wrote: Reason for CRM: Left message for patient to schedule Annual Wellness Visit.  Please schedule with Nurse Health Advisor Charlott Rakes, RN at [facility].  Please call [phone] ask for [facility]

## 2021-10-13 ENCOUNTER — Ambulatory Visit (INDEPENDENT_AMBULATORY_CARE_PROVIDER_SITE_OTHER): Payer: Medicare Other

## 2021-10-13 DIAGNOSIS — Z0000 Encounter for general adult medical examination without abnormal findings: Secondary | ICD-10-CM

## 2021-10-13 NOTE — Patient Instructions (Signed)
Mr. Carl Lopez , Thank you for taking time to come for your Medicare Wellness Visit. I appreciate your ongoing commitment to your health goals. Please review the following plan we discussed and let me know if I can assist you in the future.   Screening recommendations/referrals: Colonoscopy: Done 11/22/15 repeat every 7 years   Recommended yearly ophthalmology/optometry visit for glaucoma screening and checkup Recommended yearly dental visit for hygiene and checkup  Vaccinations: Influenza vaccine: Done 02/02/21 repeat every year  Pneumococcal vaccine: Up to date Tdap vaccine: Done 08/13/17 repeat every 10 years  Shingles vaccine: Completed 3/3 and 10/14/19   Covid-19: Completed 1/20, 2/10, 01/28/20 & 5/31, 03/01/21  Advanced directives: Please bring a copy of your health care power of attorney and living will to the office at your convenience.  Conditions/risks identified: Drink more water   Next appointment: Follow up in one year for your annual wellness visit.   Preventive Care 14 Years and Older, Male Preventive care refers to lifestyle choices and visits with your health care provider that can promote health and wellness. What does preventive care include? A yearly physical exam. This is also called an annual well check. Dental exams once or twice a year. Routine eye exams. Ask your health care provider how often you should have your eyes checked. Personal lifestyle choices, including: Daily care of your teeth and gums. Regular physical activity. Eating a healthy diet. Avoiding tobacco and drug use. Limiting alcohol use. Practicing safe sex. Taking low doses of aspirin every day. Taking vitamin and mineral supplements as recommended by your health care provider. What happens during an annual well check? The services and screenings done by your health care provider during your annual well check will depend on your age, overall health, lifestyle risk factors, and family history of  disease. Counseling  Your health care provider may ask you questions about your: Alcohol use. Tobacco use. Drug use. Emotional well-being. Home and relationship well-being. Sexual activity. Eating habits. History of falls. Memory and ability to understand (cognition). Work and work Statistician. Screening  You may have the following tests or measurements: Height, weight, and BMI. Blood pressure. Lipid and cholesterol levels. These may be checked every 5 years, or more frequently if you are over 75 years old. Skin check. Lung cancer screening. You may have this screening every year starting at age 67 if you have a 30-pack-year history of smoking and currently smoke or have quit within the past 15 years. Fecal occult blood test (FOBT) of the stool. You may have this test every year starting at age 52. Flexible sigmoidoscopy or colonoscopy. You may have a sigmoidoscopy every 5 years or a colonoscopy every 10 years starting at age 60. Prostate cancer screening. Recommendations will vary depending on your family history and other risks. Hepatitis C blood test. Hepatitis B blood test. Sexually transmitted disease (STD) testing. Diabetes screening. This is done by checking your blood sugar (glucose) after you have not eaten for a while (fasting). You may have this done every 1-3 years. Abdominal aortic aneurysm (AAA) screening. You may need this if you are a current or former smoker. Osteoporosis. You may be screened starting at age 31 if you are at high risk. Talk with your health care provider about your test results, treatment options, and if necessary, the need for more tests. Vaccines  Your health care provider may recommend certain vaccines, such as: Influenza vaccine. This is recommended every year. Tetanus, diphtheria, and acellular pertussis (Tdap, Td) vaccine. You may  need a Td booster every 10 years. Zoster vaccine. You may need this after age 50. Pneumococcal 13-valent  conjugate (PCV13) vaccine. One dose is recommended after age 5. Pneumococcal polysaccharide (PPSV23) vaccine. One dose is recommended after age 7. Talk to your health care provider about which screenings and vaccines you need and how often you need them. This information is not intended to replace advice given to you by your health care provider. Make sure you discuss any questions you have with your health care provider. Document Released: 05/14/2015 Document Revised: 01/05/2016 Document Reviewed: 02/16/2015 Elsevier Interactive Patient Education  2017 Register Prevention in the Home Falls can cause injuries. They can happen to people of all ages. There are many things you can do to make your home safe and to help prevent falls. What can I do on the outside of my home? Regularly fix the edges of walkways and driveways and fix any cracks. Remove anything that might make you trip as you walk through a door, such as a raised step or threshold. Trim any bushes or trees on the path to your home. Use bright outdoor lighting. Clear any walking paths of anything that might make someone trip, such as rocks or tools. Regularly check to see if handrails are loose or broken. Make sure that both sides of any steps have handrails. Any raised decks and porches should have guardrails on the edges. Have any leaves, snow, or ice cleared regularly. Use sand or salt on walking paths during winter. Clean up any spills in your garage right away. This includes oil or grease spills. What can I do in the bathroom? Use night lights. Install grab bars by the toilet and in the tub and shower. Do not use towel bars as grab bars. Use non-skid mats or decals in the tub or shower. If you need to sit down in the shower, use a plastic, non-slip stool. Keep the floor dry. Clean up any water that spills on the floor as soon as it happens. Remove soap buildup in the tub or shower regularly. Attach bath mats  securely with double-sided non-slip rug tape. Do not have throw rugs and other things on the floor that can make you trip. What can I do in the bedroom? Use night lights. Make sure that you have a light by your bed that is easy to reach. Do not use any sheets or blankets that are too big for your bed. They should not hang down onto the floor. Have a firm chair that has side arms. You can use this for support while you get dressed. Do not have throw rugs and other things on the floor that can make you trip. What can I do in the kitchen? Clean up any spills right away. Avoid walking on wet floors. Keep items that you use a lot in easy-to-reach places. If you need to reach something above you, use a strong step stool that has a grab bar. Keep electrical cords out of the way. Do not use floor polish or wax that makes floors slippery. If you must use wax, use non-skid floor wax. Do not have throw rugs and other things on the floor that can make you trip. What can I do with my stairs? Do not leave any items on the stairs. Make sure that there are handrails on both sides of the stairs and use them. Fix handrails that are broken or loose. Make sure that handrails are as long as the stairways.  Check any carpeting to make sure that it is firmly attached to the stairs. Fix any carpet that is loose or worn. Avoid having throw rugs at the top or bottom of the stairs. If you do have throw rugs, attach them to the floor with carpet tape. Make sure that you have a light switch at the top of the stairs and the bottom of the stairs. If you do not have them, ask someone to add them for you. What else can I do to help prevent falls? Wear shoes that: Do not have high heels. Have rubber bottoms. Are comfortable and fit you well. Are closed at the toe. Do not wear sandals. If you use a stepladder: Make sure that it is fully opened. Do not climb a closed stepladder. Make sure that both sides of the stepladder  are locked into place. Ask someone to hold it for you, if possible. Clearly mark and make sure that you can see: Any grab bars or handrails. First and last steps. Where the edge of each step is. Use tools that help you move around (mobility aids) if they are needed. These include: Canes. Walkers. Scooters. Crutches. Turn on the lights when you go into a dark area. Replace any light bulbs as soon as they burn out. Set up your furniture so you have a clear path. Avoid moving your furniture around. If any of your floors are uneven, fix them. If there are any pets around you, be aware of where they are. Review your medicines with your doctor. Some medicines can make you feel dizzy. This can increase your chance of falling. Ask your doctor what other things that you can do to help prevent falls. This information is not intended to replace advice given to you by your health care provider. Make sure you discuss any questions you have with your health care provider. Document Released: 02/11/2009 Document Revised: 09/23/2015 Document Reviewed: 05/22/2014 Elsevier Interactive Patient Education  2017 [employer].

## 2021-10-13 NOTE — Progress Notes (Addendum)
Virtual Visit via Telephone Note  I connected with  Carl Lopez on 10/13/21 at  1:30 PM EDT by telephone and verified that I am speaking with the correct person using two identifiers.  Medicare Annual Wellness visit completed telephonically due to Covid-19 pandemic.   Persons participating in this call: This Health Coach and this patient.   Location: Patient: Home Provider: Office   I discussed the limitations, risks, security and privacy concerns of performing an evaluation and management service by telephone and the availability of in person appointments. The patient expressed understanding and agreed to proceed.  Unable to perform video visit due to video visit attempted and failed and/or patient does not have video capability.   Some vital signs may be absent or patient reported.   Willette Brace, LPN   Subjective:   Carl Lopez is a 71 y.o. male who presents for Medicare Annual/Subsequent preventive examination.  Review of Systems     Cardiac Risk Factors include: advanced age (>77mn, >>49women);male gender;dyslipidemia     Objective:    There were no vitals filed for this visit. There is no height or weight on file to calculate BMI.     10/13/2021    1:32 PM 08/08/2021    2:59 PM 08/26/2019   10:47 AM 11/28/2017    8:22 AM 11/22/2015    8:53 AM 11/08/2015   10:43 AM  Advanced Directives  Does Patient Have a Medical Advance Directive? Yes Yes No No No No  [place];Out of facility DNR (pink MOST or yellow form)      Copy of [place] Chart? No - copy requested       Would patient like information on creating a medical advance directive?   Yes (MAU/Ambulatory/Procedural Areas - Information given) No - Patient declined      Current Medications (verified) Outpatient Encounter Medications as of 10/13/2021  Medication Sig   aspirin EC 81 MG tablet Take 81  mg by mouth daily. Swallow whole.   atorvastatin (LIPITOR) 40 MG tablet TAKE 1 TABLET BY MOUTH  DAILY   cyanocobalamin (,VITAMIN B-12,) 1000 MCG/ML injection 1000 mcg (1 mg) injection once per week for four weeks, followed by 1000 mcg injection once per month.   ferrous sulfate 325 (65 FE) MG tablet Take 325 mg by mouth once a week.   levothyroxine (SYNTHROID) 137 MCG tablet TAKE 1 TABLET BY MOUTH  DAILY BEFORE BREAKFAST   Multiple Vitamins-Minerals (PRESERVISION AREDS 2 PO) Take 1 tablet by mouth daily.   No facility-administered encounter medications on file as of 10/13/2021.    Allergies (verified) Penicillins   History: Past Medical History:  Diagnosis Date   BACK PAIN 01/26/2009   1987     Chronic kidney disease    kidney stone 2017   DIVERTICULOSIS, COLON 07/29/2007   Hyperlipidemia    Thyroid disease    89ears old, meds 2-3 years   Past Surgical History:  Procedure Laterality Date   [place]  [place]  INGUINAL HERNIA REPAIR  05-31-10   POLYPECTOMY     Family History  Problem Relation Age of Onset   Colon cancer Maternal Uncle    Colon cancer Other    Breast cancer Mother        795  Dementia Father        828  CVA Father    Breast cancer Maternal Grandmother    Diabetes Maternal Grandfather    Kidney disease Paternal Grandfather    Esophageal cancer Neg Hx    Rectal cancer Neg Hx    Stomach cancer Neg Hx    Social History   Socioeconomic History   Marital status: Married    Spouse name: Not on file   Number of children: 0   Years of education: Not on file   Highest education level: Not on file  Occupational History   Not on file  Tobacco Use   Smoking status: Never   Smokeless tobacco: Never  Vaping Use   Vaping Use: Never used  Substance and Sexual Activity   Alcohol use: Not Currently    Alcohol/week: 2.0 - 4.0 standard drinks of alcohol    Types: 2 - 4 Glasses of wine per week   Drug use:  No   Sexual activity: Yes  Other Topics Concern   Not on file  Social History Narrative   Married 26 years in 2015. No kids. 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, music, movies, reading      Right Handed    Lives in a one story home    Social Determinants of Health   Financial Resource Strain: Low Risk  (10/13/2021)   Overall Financial Resource Strain (CARDIA)    Difficulty of Paying Living Expenses: Not hard at all  Food Insecurity: No Food Insecurity (10/13/2021)   Hunger Vital Sign    Worried About Running Out of Food in the Last Year: Never true    [place] in the Last Year: Never true  Transportation Needs: No Transportation Needs (10/13/2021)   PRAPARE - Hydrologist (Medical): No    Lack of Transportation (Non-Medical): No  Physical Activity: Inactive (10/13/2021)   Exercise Vital Sign    Days of Exercise per Week: 0 days    Minutes of Exercise per Session: 0 min  Stress: No Stress Concern Present (10/13/2021)   [place]    Feeling of Stress : Not at all  Social Connections: Moderately Integrated (10/13/2021)   Social Connection and Isolation Panel [NHANES]    Frequency of Communication with Friends and Family: More than three times a week    Frequency of Social Gatherings with Friends and Family: More than three times a week    Attends Religious Services: More than 4 times per year    Active Member of [employer] or Organizations: No    Attends Music therapist: Never    Marital Status: Married    Tobacco Counseling Counseling given: Not Answered   Clinical Intake:  Pre-visit preparation completed: Yes  Pain : No/denies pain     BMI - recorded: 25.68 Nutritional Status: BMI 25 -29 Overweight Nutritional Risks: None Diabetes: No  How often do you need to have someone help you when you read instructions, pamphlets, or  other written materials from your doctor or pharmacy?: 1 - Never  Diabetic?no  Interpreter Needed?: No  Information entered by :: Charlott Rakes, LPN   Activities of Daily Living    10/13/2021    1:33 PM  In your present state of health, do you have any difficulty performing the following activities:  Hearing? 0  Vision? 0  Difficulty concentrating or making decisions? 0  Walking or climbing stairs? 0  Dressing or bathing? 0  Doing errands, shopping?  0  Preparing Food and eating ? N  Using the Toilet? N  In the past six months, have you accidently leaked urine? N  Do you have problems with loss of bowel control? N  Managing your Medications? N  Managing your Finances? N  Housekeeping or managing your Housekeeping? N    Patient Care Team: Marin Olp, MD as PCP - General (Family Medicine) Rutherford Guys, MD as Consulting Physician (Ophthalmology) Ardis Hughs, MD as Consulting Physician (Urology) Alda Berthold, DO as Consulting Physician (Neurology)  Indicate any recent Medical Services you may have received from other than [facility] providers in the past year (date may be approximate).     Assessment:   This is a routine wellness examination for Carl Lopez.  Hearing/Vision screen Hearing Screening - Comments:: Pt denies any hearing aids  Vision Screening - Comments:: Pt follows up with Dr Gershon Crane for annual eye exams   Dietary issues and exercise activities discussed: Current Exercise Habits: The patient does not participate in regular exercise at present   Goals Addressed             This Visit's Progress    Patient Stated       Drink more water        Depression Screen    10/13/2021    1:31 PM 06/13/2021    2:15 PM 11/04/2019    4:01 PM 08/26/2019   10:47 AM 10/14/2018    1:01 PM 11/28/2017    8:27 AM 08/13/2017    9:58 AM  PHQ 2/9 Scores  PHQ - 2 Score 0 0 0 0 0 0 0  PHQ- 9 Score  '1 2   1     '$ Fall Risk    10/13/2021    1:33 PM 08/08/2021     2:59 PM 06/13/2021    2:15 PM 08/26/2019   10:47 AM 10/14/2018    1:00 PM  [place] in the past year? 0 0 0 0 0  Number falls in past yr: 0 0 0 0   Injury with Fall? 0 0 0 0   Risk for fall due to : Impaired vision   No Fall Risks   Follow up Falls prevention discussed   Falls evaluation completed;Education provided;Falls prevention discussed     FALL RISK PREVENTION PERTAINING TO THE HOME:  Any stairs in or around the home? No  If so, are there any without handrails? No  Home free of loose throw rugs in walkways, pet beds, electrical cords, etc? Yes  Adequate lighting in your home to reduce risk of falls? Yes   ASSISTIVE DEVICES UTILIZED TO PREVENT FALLS:  Life alert? No  Use of a cane, walker or w/c? No  Grab bars in the bathroom? No  Shower chair or bench in shower? Yes  Elevated toilet seat or a handicapped toilet? No   TIMED UP AND GO:  Was the test performed? No .  Cognitive Function:        10/13/2021    1:34 PM 08/26/2019   10:48 AM 11/28/2017    8:37 AM  6CIT Screen  What Year? 0 points 0 points 0 points  What month? 0 points 0 points 0 points  What time? 0 points 0 points 0 points  Count back from 20 0 points 0 points 0 points  Months in reverse 0 points 0 points 0 points  Repeat phrase 0 points 0 points   Total Score 0 points 0 points  Immunizations Immunization History  Administered Date(s) Administered   Fluad Quad(high Dose 65+) 02/05/2019, 02/02/2021   Influenza Split 02/27/2011, 02/09/2012   Influenza Whole 01/26/2009, 02/03/2010   Influenza, High Dose Seasonal PF 02/09/2016, 01/31/2017, 02/05/2018   Influenza,inj,Quad PF,6+ Mos 01/31/2013, 12/29/2013, 12/28/2014   Influenza-Unspecified 02/17/2020   PFIZER(Purple Top)SARS-COV-2 Vaccination 05/21/2019, 06/11/2019, 01/28/2020, 09/28/2020   Pfizer Covid-19 Vaccine Bivalent Booster 83yr & up 03/01/2021   Pneumococcal Conjugate-13 06/30/2015   Pneumococcal Polysaccharide-23 12/29/2013    Td 06/29/2005, 08/13/2017   Zoster Recombinat (Shingrix) 07/02/2019, 10/14/2019   Zoster, Live 11/27/2011    TDAP status: Up to date  Flu Vaccine status: Up to date  Pneumococcal vaccine status: Up to date  Covid-19 vaccine status: Completed vaccines  Qualifies for Shingles Vaccine? Yes   Zostavax completed Yes   Shingrix Completed?: Yes  Screening Tests Health Maintenance  Topic Date Due   Hepatitis C Screening  06/05/2109 (Originally 08/15/1968)   INFLUENZA VACCINE  11/29/2021   COLONOSCOPY (Pts 45-433yrInsurance coverage will need to be confirmed)  11/22/2022   TETANUS/TDAP  08/14/2027   Pneumonia Vaccine 6541Years old  Completed   COVID-19 Vaccine  Completed   Zoster Vaccines- Shingrix  Completed   HPV VACCINES  Aged Out    Health Maintenance  There are no preventive care reminders to display for this patient.  Colorectal cancer screening: Type of screening: Colonoscopy. Completed 11/22/15. Repeat every 7 years    Additional Screening:  Hepatitis C Screening: does qualify;   Vision Screening: Recommended annual ophthalmology exams for early detection of glaucoma and other disorders of the eye. Is the patient up to date with their annual eye exam?  Yes  Who is the provider or what is the name of the office in which the patient attends annual eye exams? Dr ShGershon CraneIf pt is not established with a provider, would they like to be referred to a provider to establish care? No .   Dental Screening: Recommended annual dental exams for proper oral hygiene  Community Resource Referral / Chronic Care Management: CRR required this visit?  No   CCM required this visit?  No      Plan:     I have personally reviewed and noted the following in the patient's chart:   Medical and social history Use of alcohol, tobacco or illicit drugs  Current medications and supplements including opioid prescriptions. Patient is not currently taking opioid prescriptions. Functional  ability and status Nutritional status Physical activity Advanced directives List of other physicians Hospitalizations, surgeries, and ER visits in previous 12 months Vitals Screenings to include cognitive, depression, and falls Referrals and appointments  In addition, I have reviewed and discussed with patient certain preventive protocols, quality metrics, and best practice recommendations. A written personalized care plan for preventive services as well as general preventive health recommendations were provided to patient.     TiWillette BraceLPN   10/04/55/3220 Nurse Notes: None

## 2022-01-23 ENCOUNTER — Encounter: Payer: Self-pay | Admitting: *Deleted

## 2022-01-31 ENCOUNTER — Other Ambulatory Visit: Payer: Self-pay | Admitting: Family Medicine

## 2022-02-06 ENCOUNTER — Other Ambulatory Visit: Payer: Self-pay | Admitting: Family Medicine

## 2022-03-30 ENCOUNTER — Ambulatory Visit: Payer: Medicare Other | Admitting: Physician Assistant

## 2022-05-09 DIAGNOSIS — H25813 Combined forms of age-related cataract, bilateral: Secondary | ICD-10-CM | POA: Diagnosis not present

## 2022-07-01 ENCOUNTER — Other Ambulatory Visit: Payer: Self-pay | Admitting: Family Medicine

## 2022-08-07 ENCOUNTER — Encounter: Payer: Self-pay | Admitting: Family Medicine

## 2022-08-07 ENCOUNTER — Ambulatory Visit (INDEPENDENT_AMBULATORY_CARE_PROVIDER_SITE_OTHER): Payer: Medicare Other | Admitting: Family Medicine

## 2022-08-07 VITALS — BP 130/80 | HR 68 | Temp 97.8°F | Ht 70.0 in | Wt 181.8 lb

## 2022-08-07 DIAGNOSIS — E785 Hyperlipidemia, unspecified: Secondary | ICD-10-CM

## 2022-08-07 DIAGNOSIS — Z0000 Encounter for general adult medical examination without abnormal findings: Secondary | ICD-10-CM | POA: Diagnosis not present

## 2022-08-07 DIAGNOSIS — Z131 Encounter for screening for diabetes mellitus: Secondary | ICD-10-CM | POA: Diagnosis not present

## 2022-08-07 DIAGNOSIS — E039 Hypothyroidism, unspecified: Secondary | ICD-10-CM | POA: Diagnosis not present

## 2022-08-07 DIAGNOSIS — Z1283 Encounter for screening for malignant neoplasm of skin: Secondary | ICD-10-CM | POA: Diagnosis not present

## 2022-08-07 DIAGNOSIS — E538 Deficiency of other specified B group vitamins: Secondary | ICD-10-CM

## 2022-08-07 DIAGNOSIS — R739 Hyperglycemia, unspecified: Secondary | ICD-10-CM

## 2022-08-07 DIAGNOSIS — Z125 Encounter for screening for malignant neoplasm of prostate: Secondary | ICD-10-CM

## 2022-08-07 LAB — LIPID PANEL
Cholesterol: 134 mg/dL (ref 0–200)
HDL: 45.1 mg/dL (ref >39.00)
LDL Cholesterol: 63 mg/dL (ref 0–99)
NonHDL: 88.95
Total CHOL/HDL Ratio: 3
Triglycerides: 129 mg/dL (ref 0.0–149.0)
VLDL: 25.8 mg/dL (ref 0.0–40.0)

## 2022-08-07 LAB — COMPREHENSIVE METABOLIC PANEL
ALT: 12 U/L (ref 0–53)
AST: 13 U/L (ref 0–37)
Albumin: 4 g/dL (ref 3.5–5.2)
Alkaline Phosphatase: 91 U/L (ref 39–117)
BUN: 17 mg/dL (ref 6–23)
CO2: 30 mEq/L (ref 19–32)
Calcium: 8.9 mg/dL (ref 8.4–10.5)
Chloride: 103 mEq/L (ref 96–112)
Creatinine, Ser: 1.03 mg/dL (ref 0.40–1.50)
GFR: 71.83 mL/min (ref >60.00)
Glucose, Bld: 103 mg/dL — ABNORMAL HIGH (ref 70–99)
Potassium: 4 mEq/L (ref 3.5–5.1)
Sodium: 140 mEq/L (ref 135–145)
Total Bilirubin: 0.7 mg/dL (ref 0.2–1.2)
Total Protein: 6 g/dL (ref 6.0–8.3)

## 2022-08-07 LAB — CBC WITH DIFFERENTIAL/PLATELET
Basophils Absolute: 0 10*3/uL (ref 0.0–0.1)
Basophils Relative: 0.5 % (ref 0.0–3.0)
Eosinophils Absolute: 0 10*3/uL (ref 0.0–0.7)
Eosinophils Relative: 1 % (ref 0.0–5.0)
HCT: 44.4 % (ref 39.0–52.0)
Hemoglobin: 15.4 g/dL (ref 13.0–17.0)
Lymphocytes Relative: 18 % (ref 12.0–46.0)
Lymphs Abs: 0.9 10*3/uL (ref 0.7–4.0)
MCHC: 34.6 g/dL (ref 30.0–36.0)
MCV: 93.4 fl (ref 78.0–100.0)
Monocytes Absolute: 0.4 10*3/uL (ref 0.1–1.0)
Monocytes Relative: 7.2 % (ref 3.0–12.0)
Neutro Abs: 3.7 10*3/uL (ref 1.4–7.7)
Neutrophils Relative %: 73.3 % (ref 43.0–77.0)
Platelets: 253 10*3/uL (ref 150.0–400.0)
RBC: 4.75 Mil/uL (ref 4.22–5.81)
RDW: 12.9 % (ref 11.5–15.5)
WBC: 5 10*3/uL (ref 4.0–10.5)

## 2022-08-07 LAB — PSA, MEDICARE: PSA: 0.87 ng/ml (ref 0.10–4.00)

## 2022-08-07 LAB — HEMOGLOBIN A1C: Hgb A1c MFr Bld: 5.5 % (ref 4.6–6.5)

## 2022-08-07 LAB — VITAMIN B12: Vitamin B-12: 274 pg/mL (ref 211–911)

## 2022-08-07 LAB — TSH: TSH: 1.73 u[IU]/mL (ref 0.35–5.50)

## 2022-08-07 MED ORDER — TADALAFIL 20 MG PO TABS: 20.0000 mg | ORAL_TABLET | Freq: Every day | ORAL | 5 refills | Status: AC | PRN

## 2022-08-07 NOTE — Patient Instructions (Addendum)
Team give good prescription card. Tadalafil every 72 hours max trial  Due for colonoscopy late July   We will either call you or see alternate belowwithin two weeks about your referral to Dr. Onalee Hua . Our referral specialist will sometimes also send you a mychart link once referral is approved and then you will call the # listed on there (let us know if you do not see this within 2 weeks or have not received call)  Please stop by lab before you go If you have mychart- we will send your results within 3 business days of [place] receiving them.  If you do not have mychart- we will call you about results within 5 business days of [place] receiving them.  *please also note that you will see labs on mychart as soon as they post. I will later go in and write notes on them- will say "notes from Dr. Durene Cal"   Recommended follow up: Return in about 1 year (around 08/07/2023) for physical or sooner if needed.Schedule b4 you leave.

## 2022-08-07 NOTE — Progress Notes (Signed)
Phone: [phone]   Subjective:  Patient presents today for their annual physical. Chief complaint-noted.   See problem oriented charting- ROS- full  review of systems was completed and negative  except for: constipation related to wheat intake- essentially avoids- can take 4-5 days to have an extremely hard bowel movement that he has to strain on   The following were reviewed and entered/updated in epic: Past Medical History:  Diagnosis Date   BACK PAIN 01/26/2009   1987     Chronic kidney disease    kidney stone 2017   DIVERTICULOSIS, COLON 07/29/2007   Hyperlipidemia    Thyroid disease    72years old, meds 2-3 years   Patient Active Problem List   Diagnosis Date Noted   B12 deficiency 08/03/2021    Priority: Medium    Hypothyroidism 07/29/2007    Priority: Medium    Hyperlipidemia 07/29/2007    Priority: Medium    Nephrolithiasis 06/30/2015    Priority: Low   Skin lesion 06/29/2014    Priority: Low   Hyperglycemia 06/29/2014    Priority: Low   Abdominal mass 01/12/2014    Priority: Low   SNORING 08/03/2008    Priority: Low   COLONIC POLYPS, HX OF 07/29/2007    Priority: Low   Past Surgical History:  Procedure Laterality Date   CHOLECYSTECTOMY  1988   COLONOSCOPY     INCISIONAL HERNIA REPAIR  1988   INGUINAL HERNIA REPAIR  05-31-10   POLYPECTOMY      Family History  Problem Relation Age of Onset   Colon cancer Maternal Uncle    Colon cancer Other    Breast cancer Mother        21   Dementia Father        55   CVA Father    Breast cancer Maternal Grandmother    Diabetes Maternal Grandfather    Kidney disease Paternal Grandfather    Esophageal cancer Neg Hx    Rectal cancer Neg Hx    Stomach cancer Neg Hx     Medications- reviewed and updated Current Outpatient Medications  Medication Sig Dispense Refill   aspirin EC 81 MG tablet Take 81 mg by mouth daily. Swallow whole.     atorvastatin (LIPITOR) 40 MG tablet TAKE 1 TABLET BY MOUTH DAILY 100  tablet 2   cyanocobalamin (VITAMIN B12) 1000 MCG/ML injection INJECTION ONCE PER WEEK FOR FOUR WEEKS, FOLLOWED BY 1000 MCG INJECTION ONCE PER MONTH. 4 mL 3   ferrous sulfate 325 (65 FE) MG tablet Take 325 mg by mouth once a week.     levothyroxine (SYNTHROID) 137 MCG tablet TAKE 1 TABLET BY MOUTH DAILY  BEFORE BREAKFAST 100 tablet 2   Multiple Vitamins-Minerals (PRESERVISION AREDS 2 PO) Take 1 tablet by mouth daily.     tadalafil (CIALIS) 20 MG tablet Take 1 tablet (20 mg total) by mouth daily as needed for erectile dysfunction. 10 tablet 5   No current facility-administered medications for this visit.    Allergies-reviewed and updated Allergies  Allergen Reactions   Penicillins Swelling and Other (See Comments)    Unknown Has patient had a PCN reaction causing immediate rash, facial/tongue/throat swelling, SOB or lightheadedness with hypotension:  unknown  Has patient had a PCN reaction causing severe rash involving mucus membranes or skin necrosis: unknown Has patient had a PCN reaction that required hospitalization : unknown,  Has patient had a PCN reaction occurring within the last 10 years: No Childhood allergy, swelling of arm  after injection of penicillin    Social History   Social History Narrative   Married 26 years in 2015. No kids. Retired from [employer], baggage handler/union repHobbies: Presenter, broadcasting, music, movies, reading      Right Handed    Lives in a one story home    Objective  Objective:  BP 130/80   Pulse 68   Temp 97.8 F (36.6 C)   Ht 5\' 10"  (1.778 m)   Wt 181 lb 12.8 oz (82.5 kg)   SpO2 97%   BMI 26.09 kg/m  Gen: NAD, resting comfortably HEENT: Mucous membranes are moist. Oropharynx normal Neck: no thyromegaly CV: RRR no murmurs rubs or gallops Lungs: CTAB no crackles, wheeze, rhonchi Abdomen: soft/nontender/nondistended/normal bowel sounds. No rebound or guarding.  Ext: no edema Skin: warm, dry Neuro: grossly normal, moves all extremities,  PERRLA   Assessment and Plan  72 y.o. male presenting for annual physical.  Health Maintenance counseling: 1. Anticipatory guidance: Patient counseled regarding regular dental exams -q6 months, eye exams -yearly,  avoiding smoking and second hand smoke , limiting alcohol to 2 beverages per day - 4-6 per week, no illicit drugs .   2. Risk factor reduction:  Advised patient of need for regular exercise and diet rich and fruits and vegetables to reduce risk of heart attack and stroke.  Exercise- getting into outdoor weather which is easier for him to be active- goal 150 minutes a week.  Diet/weight management-overall stable in last year- within 3 lbs- encouraged stability- consider mild weight loss especially with prediabetes.  Wt Readings from Last 3 Encounters:  08/07/22 181 lb 12.8 oz (82.5 kg)  08/08/21 179 lb (81.2 kg)  08/03/21 178 lb 6.4 oz (80.9 kg)  3. Immunizations/screenings/ancillary studies- up to date - advised only yearly COVID shots at this point given his good overall health Immunization History  Administered Date(s) Administered   Fluad Quad(high Dose 65+) 02/05/2019, 02/02/2021, 02/13/2022   Influenza Split 02/27/2011, 02/09/2012   Influenza Whole 01/26/2009, 02/03/2010   Influenza, High Dose Seasonal PF 02/09/2016, 01/31/2017, 02/05/2018   Influenza,inj,Quad PF,6+ Mos 01/31/2013, 12/29/2013, 12/28/2014   Influenza-Unspecified 02/17/2020   PFIZER Comirnaty(Gray Top)Covid-19 Tri-Sucrose Vaccine 02/13/2022   PFIZER(Purple Top)SARS-COV-2 Vaccination 05/21/2019, 06/11/2019, 01/28/2020, 09/28/2020   Pfizer Covid-19 Vaccine Bivalent Booster 61yrs & up 03/01/2021   Pneumococcal Conjugate-13 06/30/2015   Pneumococcal Polysaccharide-23 12/29/2013   Td 06/29/2005, 08/13/2017   Zoster Recombinat (Shingrix) 07/02/2019, 10/14/2019   Zoster, Live 11/27/2011  4. Prostate cancer screening- he prefers to continue to monitor PSA given good overall health Lab Results  Component Value  Date   PSA 0.66 08/03/2021   PSA 1.42 11/05/2019   PSA 1.09 10/14/2018   5. Colon cancer screening - 11/22/15 with 7 year repeat planned (see letter from Dr. Russella Dar) -history of adenoma - he knows he will be due  6. Skin cancer screening- no dermatologist- refer again today.  advised regular sunscreen use. Denies worrisome, changing, or new skin lesions.  7. Smoking associated screening (lung cancer screening, AAA screen 65-75, UA)- never smoker 8. STD screening - not needed as monogomous  Status of chronic or acute concerns   #hyperlipidemia-question prior TIA-also has low B12 at that time S: Medication: Atorvastatin 40 mg, aspirin 81 mg Lab Results  Component Value Date   CHOL 133 08/03/2021   HDL 55.40 08/03/2021   LDLCALC 64 08/03/2021   LDLDIRECT 82.0 06/13/2021   TRIG 68.0 08/03/2021   CHOLHDL 2 08/03/2021   A/P: excellent control last year-  update lipids- continue aspirin with question of prior TIA- thankfully symptoms have resolved (tingling issues)  #hypothyroidism S: compliant On thyroid medication-levothyroxine 137 mcg Lab Results  Component Value Date   TSH 0.48 08/03/2021   A/P:hopefully stable- update tsh today. Continue current meds for now       # B12 deficiency - Discovered due to macrocytosis S: Current treatment/medication (oral vs. IM): B12 1000 mcg injections at home monthly in his thigh   -no recurrence of symptoms -we opted out of testing for antibodies Lab Results  Component Value Date   VITAMINB12 52 (L) 08/03/2021  A/P: hopefully improved- update b12 today. Continue current meds for now    #erectile dysfunction (ED) issues- having trouble maintaining erections- wants to trial tadalafil   Recommended follow up: Return in about 1 year (around 08/07/2023) for physical or sooner if needed.Schedule b4 you leave. Future Appointments  Date Time Provider Department Center  10/19/2022  1:00 PM [facility] ANNUAL WELLNESS VISIT 1 [facility]   Lab/Order  associations: fasting   ICD-10-CM   1. Preventative health care  Z00.00     2. Hyperlipidemia, unspecified hyperlipidemia type  E78.5 CBC with Differential/Platelet    Comprehensive metabolic panel    Lipid panel    3. B12 deficiency  E53.8 Vitamin B12    4. Hypothyroidism, unspecified type  E03.9 TSH    5. Hyperglycemia  R73.9 HgB A1c    6. Screening for diabetes mellitus  Z13.1 HgB A1c    7. Screening for prostate cancer  Z12.5 PSA, Medicare ( [place] only)    8. Screening exam for skin cancer  Z12.83 Ambulatory referral to Dermatology      Meds ordered this encounter  Medications   tadalafil (CIALIS) 20 MG tablet    Sig: Take 1 tablet (20 mg total) by mouth daily as needed for erectile dysfunction.    Dispense:  10 tablet    Refill:  5    Likely wants to use goodrx    Return precautions advised.  Tana ConchStephen Donell Tomkins, MD

## 2022-09-19 ENCOUNTER — Encounter: Payer: Self-pay | Admitting: Family Medicine

## 2022-09-27 ENCOUNTER — Encounter: Payer: Self-pay | Admitting: Gastroenterology

## 2022-10-19 ENCOUNTER — Encounter: Payer: Self-pay | Admitting: Gastroenterology

## 2022-10-19 ENCOUNTER — Ambulatory Visit (INDEPENDENT_AMBULATORY_CARE_PROVIDER_SITE_OTHER): Payer: Medicare Other

## 2022-10-19 VITALS — Wt 176.0 lb

## 2022-10-19 DIAGNOSIS — Z0000 Encounter for general adult medical examination without abnormal findings: Secondary | ICD-10-CM | POA: Diagnosis not present

## 2022-10-19 NOTE — Progress Notes (Signed)
Subjective:   Carl Lopez is a 72 y.o. male who presents for Medicare Annual/Subsequent preventive examination.  Visit Complete: Virtual  I connected with  Carl Lopez on 10/19/22 by a audio enabled telemedicine application and verified that I am speaking with the correct person using two identifiers.  Patient Location: Home  Provider Location: Office/Clinic  I discussed the limitations of evaluation and management by telemedicine. The patient expressed understanding and agreed to proceed.  Patient Medicare AWV questionnaire was completed by the patient on 10/15/22; I have confirmed that all information answered by patient is correct and no changes since this date.  Review of Systems     Cardiac Risk Factors include: advanced age (>71men, >33 women);male gender;dyslipidemia     Objective:    Today's Vitals   10/19/22 1300  Weight: 176 lb (79.8 kg)   Body mass index is 25.25 kg/m.     10/19/2022    1:04 PM 10/13/2021    1:32 PM 08/08/2021    2:59 PM 08/26/2019   10:47 AM 11/28/2017    8:22 AM 11/22/2015    8:53 AM 11/08/2015   10:43 AM  Advanced Directives  Does Patient Have a Medical Advance Directive? Yes Yes Yes No No No No  Type of Estate agent of [place];Living will Healthcare Power of [employer] of [place];Living will;Out of facility DNR (pink MOST or yellow form)      Copy of Healthcare Power of Attorney in Chart? No - copy requested No - copy requested       Would patient like information on creating a medical advance directive?    Yes (MAU/Ambulatory/Procedural Areas - Information given) No - Patient declined      Current Medications (verified) Outpatient Encounter Medications as of 10/19/2022  Medication Sig   aspirin EC 81 MG tablet Take 81 mg by mouth daily. Swallow whole.   atorvastatin (LIPITOR) 40 MG tablet TAKE 1 TABLET BY MOUTH DAILY   cyanocobalamin (VITAMIN B12) 1000 MCG/ML injection INJECTION ONCE PER  WEEK FOR FOUR WEEKS, FOLLOWED BY 1000 MCG INJECTION ONCE PER MONTH.   ferrous sulfate 325 (65 FE) MG tablet Take 325 mg by mouth once a week.   levothyroxine (SYNTHROID) 137 MCG tablet TAKE 1 TABLET BY MOUTH DAILY  BEFORE BREAKFAST   Multiple Vitamins-Minerals (PRESERVISION AREDS 2 PO) Take 1 tablet by mouth daily.   tadalafil (CIALIS) 20 MG tablet Take 1 tablet (20 mg total) by mouth daily as needed for erectile dysfunction.   No facility-administered encounter medications on file as of 10/19/2022.    Allergies (verified) Penicillins   History: Past Medical History:  Diagnosis Date   BACK PAIN 01/26/2009   1987     Chronic kidney disease    kidney stone 2017   DIVERTICULOSIS, COLON 07/29/2007   Hyperlipidemia    Thyroid disease    72years old, meds 2-3 years   Past Surgical History:  Procedure Laterality Date   CHOLECYSTECTOMY  1988   COLONOSCOPY     INCISIONAL HERNIA REPAIR  1988   INGUINAL HERNIA REPAIR  05-31-10   POLYPECTOMY     Family History  Problem Relation Age of Onset   Colon cancer Maternal Uncle    Colon cancer Other    Breast cancer Mother        57   Dementia Father        19   CVA Father    Breast cancer Maternal Grandmother    Diabetes  Maternal Grandfather    Kidney disease Paternal Grandfather    Esophageal cancer Neg Hx    Rectal cancer Neg Hx    Stomach cancer Neg Hx    Social History   Socioeconomic History   Marital status: Married    Spouse name: Not on file   Number of children: 0   Years of education: Not on file   Highest education level: Not on file  Occupational History   Not on file  Tobacco Use   Smoking status: Never   Smokeless tobacco: Never  Vaping Use   Vaping Use: Never used  Substance and Sexual Activity   Alcohol use: Not Currently    Alcohol/week: 2.0 - 4.0 standard drinks of alcohol    Types: 2 - 4 Glasses of wine per week   Drug use: No   Sexual activity: Yes  Other Topics Concern   Not on file  Social History  Narrative   Married 26 years in 2015. No kids. Retired from [employer], baggage handler/union repHobbies: Presenter, broadcasting, music, movies, reading      Right Handed    Lives in a one story home    Social Determinants of Health   Financial Resource Strain: Low Risk  (10/19/2022)   Overall Financial Resource Strain (CARDIA)    Difficulty of Paying Living Expenses: Not hard at all  Food Insecurity: No Food Insecurity (10/15/2022)   Hunger Vital Sign    Worried About Running Out of Food in the Last Year: Never true    Ran Out of Food in the Last Year: Never true  Transportation Needs: No Transportation Needs (10/15/2022)   PRAPARE - Administrator, Civil Service (Medical): No    Lack of Transportation (Non-Medical): No  Physical Activity: Sufficiently Active (10/15/2022)   Exercise Vital Sign    Days of Exercise per Week: 2 days    Minutes of Exercise per Session: 120 min  Stress: No Stress Concern Present (10/15/2022)   [employer] of Occupational Health - Occupational Stress Questionnaire    Feeling of Stress : Only a little  Social Connections: Socially Integrated (10/15/2022)   Social Connection and Isolation Panel [NHANES]    Frequency of Communication with Friends and Family: Three times a week    Frequency of Social Gatherings with Friends and Family: Once a week    Attends Religious Services: More than 4 times per year    Active Member of [employer] or Organizations: Yes    Attends Banker Meetings: Patient declined    Marital Status: Married    Tobacco Counseling Counseling given: Not Answered   Clinical Intake:  Pre-visit preparation completed: Yes  Pain : No/denies pain     BMI - recorded: 25.25 Nutritional Status: BMI 25 -29 Overweight Nutritional Risks: None Diabetes: No  How often do you need to have someone help you when you read instructions, pamphlets, or other written materials from your doctor or pharmacy?: 1 - Never  Interpreter  Needed?: No  Information entered by :: Lanier Ensign, LPN   Activities of Daily Living    10/15/2022    1:03 PM  In your present state of health, do you have any difficulty performing the following activities:  Hearing? 0  Vision? 0  Difficulty concentrating or making decisions? 0  Walking or climbing stairs? 0  Dressing or bathing? 0  Doing errands, shopping? 0  Preparing Food and eating ? N  Using the Toilet? N  In the past six  months, have you accidently leaked urine? N  Do you have problems with loss of bowel control? N  Managing your Medications? N  Managing your Finances? N  Housekeeping or managing your Housekeeping? N    Patient Care Team: Shelva Majestic, MD as PCP - General (Family Medicine) Jethro Bolus, MD as Consulting Physician (Ophthalmology) Crist Fat, MD as Consulting Physician (Urology) Glendale Chard, DO as Consulting Physician (Neurology)  Indicate any recent Medical Services you may have received from other than [facility] providers in the past year (date may be approximate).     Assessment:   This is a routine wellness examination for Carl Lopez.  Hearing/Vision screen Hearing Screening - Comments:: Pt denies any hearing issues  Vision Screening - Comments:: Pt followed Dr Nile Riggs for annual eye exams will find new provider   Dietary issues and exercise activities discussed:     Goals Addressed             This Visit's Progress    Patient Stated       Drink more water        Depression Screen    10/19/2022    1:03 PM 08/07/2022   10:11 AM 10/13/2021    1:31 PM 06/13/2021    2:15 PM 11/04/2019    4:01 PM 08/26/2019   10:47 AM 10/14/2018    1:01 PM  PHQ 2/9 Scores  PHQ - 2 Score 0 0 0 0 0 0 0  PHQ- 9 Score  0  1 2      Fall Risk    10/15/2022    1:03 PM 08/07/2022   10:11 AM 10/13/2021    1:33 PM 08/08/2021    2:59 PM 06/13/2021    2:15 PM  Fall Risk   Falls in the past year? 0 0 0 0 0  Number falls in past yr:  0 0 0 0   Injury with Fall? 0 0 0 0 0  Risk for fall due to : Impaired vision No Fall Risks Impaired vision    Follow up Falls prevention discussed Falls evaluation completed Falls prevention discussed      MEDICARE RISK AT HOME:  Medicare Risk at Home - 10/19/22 1305     Any stairs in or around the home? Yes    If so, are there any without handrails? Yes    Home free of loose throw rugs in walkways, pet beds, electrical cords, etc? Yes    Adequate lighting in your home to reduce risk of falls? Yes    Life alert? No    Use of a cane, walker or w/c? No    Grab bars in the bathroom? Yes    Shower chair or bench in shower? Yes    Elevated toilet seat or a handicapped toilet? No             TIMED UP AND GO:  Was the test performed?  No    Cognitive Function:        10/19/2022    1:06 PM 10/13/2021    1:34 PM 08/26/2019   10:48 AM 11/28/2017    8:37 AM  6CIT Screen  What Year? 0 points 0 points 0 points 0 points  What month? 0 points 0 points 0 points 0 points  What time? 0 points 0 points 0 points 0 points  Count back from 20 0 points 0 points 0 points 0 points  Months in reverse 0 points 0 points 0 points 0  points  Repeat phrase 0 points 0 points 0 points   Total Score 0 points 0 points 0 points     Immunizations Immunization History  Administered Date(s) Administered   Fluad Quad(high Dose 65+) 02/05/2019, 02/02/2021, 02/13/2022   Influenza Split 02/27/2011, 02/09/2012   Influenza Whole 01/26/2009, 02/03/2010   Influenza, High Dose Seasonal PF 02/09/2016, 01/31/2017, 02/05/2018   Influenza,inj,Quad PF,6+ Mos 01/31/2013, 12/29/2013, 12/28/2014   Influenza-Unspecified 02/17/2020   PFIZER Comirnaty(Gray Top)Covid-19 Tri-Sucrose Vaccine 02/13/2022   PFIZER(Purple Top)SARS-COV-2 Vaccination 05/21/2019, 06/11/2019, 01/28/2020, 09/28/2020   Pfizer Covid-19 Vaccine Bivalent Booster 68yrs & up 03/01/2021   Pneumococcal Conjugate-13 06/30/2015   Pneumococcal Polysaccharide-23  12/29/2013   Td 06/29/2005, 08/13/2017   Zoster Recombinat (Shingrix) 07/02/2019, 10/14/2019   Zoster, Live 11/27/2011    TDAP status: Up to date  Flu Vaccine status: Up to date  Pneumococcal vaccine status: Up to date  Covid-19 vaccine status: Completed vaccines  Qualifies for Shingles Vaccine? Yes   Zostavax completed Yes   Shingrix Completed?: Yes  Screening Tests Health Maintenance  Topic Date Due   COVID-19 Vaccine (7 - 2023-24 season) 04/10/2022   Colonoscopy  11/22/2022   Hepatitis C Screening  06/05/2109 (Originally 08/15/1968)   INFLUENZA VACCINE  11/30/2022   Medicare Annual Wellness (AWV)  10/19/2023   DTaP/Tdap/Td (3 - Tdap) 08/14/2027   Pneumonia Vaccine 48+ Years old  Completed   Zoster Vaccines- Shingrix  Completed   HPV VACCINES  Aged Out    Health Maintenance  Health Maintenance Due  Topic Date Due   COVID-19 Vaccine (7 - 2023-24 season) 04/10/2022   Colonoscopy  11/22/2022    Colorectal cancer screening: Type of screening: Colonoscopy. Completed 11/22/15. Repeat every 7 years Scheduled 11/28/22  Additional Screening:  Hepatitis C Screening: does qualify  Vision Screening: Recommended annual ophthalmology exams for early detection of glaucoma and other disorders of the eye. Is the patient up to date with their annual eye exam?  Yes  Who is the provider or what is the name of the office in which the patient attends annual eye exams? Will find new provider  If pt is not established with a provider, would they like to be referred to a provider to establish care? No .   Dental Screening: Recommended annual dental exams for proper oral hygiene    Community Resource Referral / Chronic Care Management: CRR required this visit?  No   CCM required this visit?  No     Plan:     I have personally reviewed and noted the following in the patient's chart:   Medical and social history Use of alcohol, tobacco or illicit drugs  Current medications and  supplements including opioid prescriptions. Patient is not currently taking opioid prescriptions. Functional ability and status Nutritional status Physical activity Advanced directives List of other physicians Hospitalizations, surgeries, and ER visits in previous 12 months Vitals Screenings to include cognitive, depression, and falls Referrals and appointments  In addition, I have reviewed and discussed with patient certain preventive protocols, quality metrics, and best practice recommendations. A written personalized care plan for preventive services as well as general preventive health recommendations were provided to patient.     Marzella Schlein, LPN   05/31/8655   After Visit Summary: (MyChart) Due to this being a telephonic visit, the after visit summary with patients personalized plan was offered to patient via MyChart   Nurse Notes: none

## 2022-10-19 NOTE — Patient Instructions (Signed)
Carl Lopez , Thank you for taking time to come for your Medicare Wellness Visit. I appreciate your ongoing commitment to your health goals. Please review the following plan we discussed and let me know if I can assist you in the future.   These are the goals we discussed:  Goals      DIET - INCREASE WATER INTAKE     Currently drinks 1-2 bottles a day of water. Would like to increase to 3-4 bottles a day.     Patient Stated     Drink more water      Patient Stated     Drink more water         This is a list of the screening recommended for you and due dates:  Health Maintenance  Topic Date Due   COVID-19 Vaccine (7 - 2023-24 season) 04/10/2022   Colon Cancer Screening  11/22/2022   Hepatitis C Screening  06/05/2109*   Flu Shot  11/30/2022   Medicare Annual Wellness Visit  10/19/2023   DTaP/Tdap/Td vaccine (3 - Tdap) 08/14/2027   Pneumonia Vaccine  Completed   Zoster (Shingles) Vaccine  Completed   HPV Vaccine  Aged Out  *Topic was postponed. The date shown is not the original due date.    Advanced directives: Please bring a copy of your health care power of attorney and living will to the office at your convenience.  Conditions/risks identified: drink me water   Next appointment: Follow up in one year for your annual wellness visit.   Preventive Care 25 Years and Older, Male  Preventive care refers to lifestyle choices and visits with your health care provider that can promote health and wellness. What does preventive care include? A yearly physical exam. This is also called an annual well check. Dental exams once or twice a year. Routine eye exams. Ask your health care provider how often you should have your eyes checked. Personal lifestyle choices, including: Daily care of your teeth and gums. Regular physical activity. Eating a healthy diet. Avoiding tobacco and drug use. Limiting alcohol use. Practicing safe sex. Taking low doses of aspirin every day. Taking  vitamin and mineral supplements as recommended by your health care provider. What happens during an annual well check? The services and screenings done by your health care provider during your annual well check will depend on your age, overall health, lifestyle risk factors, and family history of disease. Counseling  Your health care provider may ask you questions about your: Alcohol use. Tobacco use. Drug use. Emotional well-being. Home and relationship well-being. Sexual activity. Eating habits. History of falls. Memory and ability to understand (cognition). Work and work Astronomer. Screening  You may have the following tests or measurements: Height, weight, and BMI. Blood pressure. Lipid and cholesterol levels. These may be checked every 5 years, or more frequently if you are over 45 years old. Skin check. Lung cancer screening. You may have this screening every year starting at age 2 if you have a 30-pack-year history of smoking and currently smoke or have quit within the past 15 years. Fecal occult blood test (FOBT) of the stool. You may have this test every year starting at age 22. Flexible sigmoidoscopy or colonoscopy. You may have a sigmoidoscopy every 5 years or a colonoscopy every 10 years starting at age 63. Prostate cancer screening. Recommendations will vary depending on your family history and other risks. Hepatitis C blood test. Hepatitis B blood test. Sexually transmitted disease (STD) testing. Diabetes screening.  This is done by checking your blood sugar (glucose) after you have not eaten for a while (fasting). You may have this done every 1-3 years. Abdominal aortic aneurysm (AAA) screening. You may need this if you are a current or former smoker. Osteoporosis. You may be screened starting at age 74 if you are at high risk. Talk with your health care provider about your test results, treatment options, and if necessary, the need for more tests. Vaccines  Your  health care provider may recommend certain vaccines, such as: Influenza vaccine. This is recommended every year. Tetanus, diphtheria, and acellular pertussis (Tdap, Td) vaccine. You may need a Td booster every 10 years. Zoster vaccine. You may need this after age 72. Pneumococcal 13-valent conjugate (PCV13) vaccine. One dose is recommended after age 91. Pneumococcal polysaccharide (PPSV23) vaccine. One dose is recommended after age 79. Talk to your health care provider about which screenings and vaccines you need and how often you need them. This information is not intended to replace advice given to you by your health care provider. Make sure you discuss any questions you have with your health care provider. Document Released: 05/14/2015 Document Revised: 01/05/2016 Document Reviewed: 02/16/2015 Elsevier Interactive Patient Education  2017 [place] Prevention in the Home Falls can cause injuries. They can happen to people of all ages. There are many things you can do to make your home safe and to help prevent falls. What can I do on the outside of my home? Regularly fix the edges of walkways and driveways and fix any cracks. Remove anything that might make you trip as you walk through a door, such as a raised step or threshold. Trim any bushes or trees on the path to your home. Use bright outdoor lighting. Clear any walking paths of anything that might make someone trip, such as rocks or tools. Regularly check to see if handrails are loose or broken. Make sure that both sides of any steps have handrails. Any raised decks and porches should have guardrails on the edges. Have any leaves, snow, or ice cleared regularly. Use sand or salt on walking paths during winter. Clean up any spills in your garage right away. This includes oil or grease spills. What can I do in the bathroom? Use night lights. Install grab bars by the toilet and in the tub and shower. Do not use towel bars as  grab bars. Use non-skid mats or decals in the tub or shower. If you need to sit down in the shower, use a plastic, non-slip stool. Keep the floor dry. Clean up any water that spills on the floor as soon as it happens. Remove soap buildup in the tub or shower regularly. Attach bath mats securely with double-sided non-slip rug tape. Do not have throw rugs and other things on the floor that can make you trip. What can I do in the bedroom? Use night lights. Make sure that you have a light by your bed that is easy to reach. Do not use any sheets or blankets that are too big for your bed. They should not hang down onto the floor. Have a firm chair that has side arms. You can use this for support while you get dressed. Do not have throw rugs and other things on the floor that can make you trip. What can I do in the kitchen? Clean up any spills right away. Avoid walking on wet floors. Keep items that you use a lot in easy-to-reach places. If  you need to reach something above you, use a strong step stool that has a grab bar. Keep electrical cords out of the way. Do not use floor polish or wax that makes floors slippery. If you must use wax, use non-skid floor wax. Do not have throw rugs and other things on the floor that can make you trip. What can I do with my stairs? Do not leave any items on the stairs. Make sure that there are handrails on both sides of the stairs and use them. Fix handrails that are broken or loose. Make sure that handrails are as long as the stairways. Check any carpeting to make sure that it is firmly attached to the stairs. Fix any carpet that is loose or worn. Avoid having throw rugs at the top or bottom of the stairs. If you do have throw rugs, attach them to the floor with carpet tape. Make sure that you have a light switch at the top of the stairs and the bottom of the stairs. If you do not have them, ask someone to add them for you. What else can I do to help prevent  falls? Wear shoes that: Do not have high heels. Have rubber bottoms. Are comfortable and fit you well. Are closed at the toe. Do not wear sandals. If you use a stepladder: Make sure that it is fully opened. Do not climb a closed stepladder. Make sure that both sides of the stepladder are locked into place. Ask someone to hold it for you, if possible. Clearly mark and make sure that you can see: Any grab bars or handrails. First and last steps. Where the edge of each step is. Use tools that help you move around (mobility aids) if they are needed. These include: Canes. Walkers. Scooters. Crutches. Turn on the lights when you go into a dark area. Replace any light bulbs as soon as they burn out. Set up your furniture so you have a clear path. Avoid moving your furniture around. If any of your floors are uneven, fix them. If there are any pets around you, be aware of where they are. Review your medicines with your doctor. Some medicines can make you feel dizzy. This can increase your chance of falling. Ask your doctor what other things that you can do to help prevent falls. This information is not intended to replace advice given to you by your health care provider. Make sure you discuss any questions you have with your health care provider. Document Released: 02/11/2009 Document Revised: 09/23/2015 Document Reviewed: 05/22/2014 Elsevier Interactive Patient Education  2017 [employer].

## 2022-10-21 IMAGING — US US CAROTID DUPLEX BILAT
1 series · 14 of 24 positions shown · non-contrast
Comparison: None.

CLINICAL DATA: Paresthesia arm

Right eye pixilated
EXAM:
BILATERAL CAROTID DUPLEX ULTRASOUND
TECHNIQUE: Gray scale imaging, color Doppler and duplex ultrasound were
performed of bilateral carotid and vertebral arteries in the neck.

[Series 1: us carotid duplex bilat · 0.07mm/px · 14 of 53 slices shown]
[im 1/53]
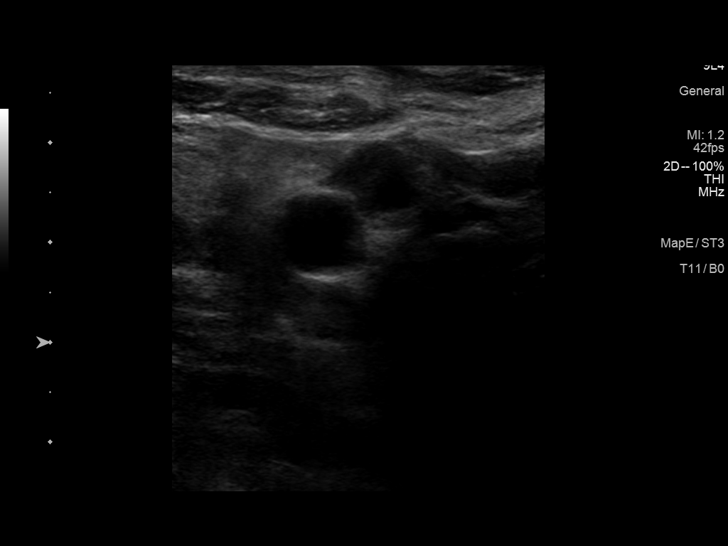
[im 5/53]
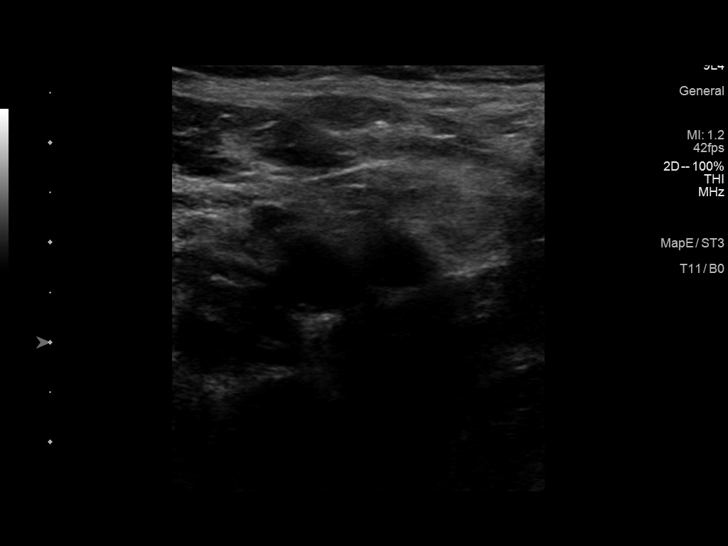
[im 10/53]
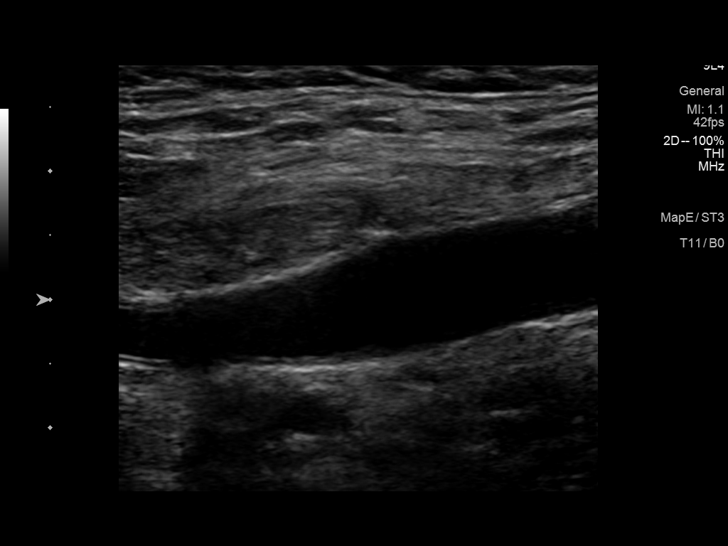
[im 14/53]
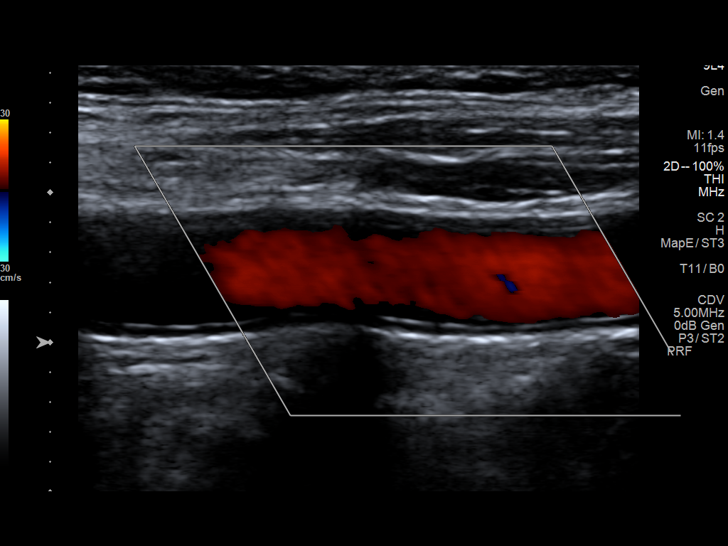
[im 16/53]
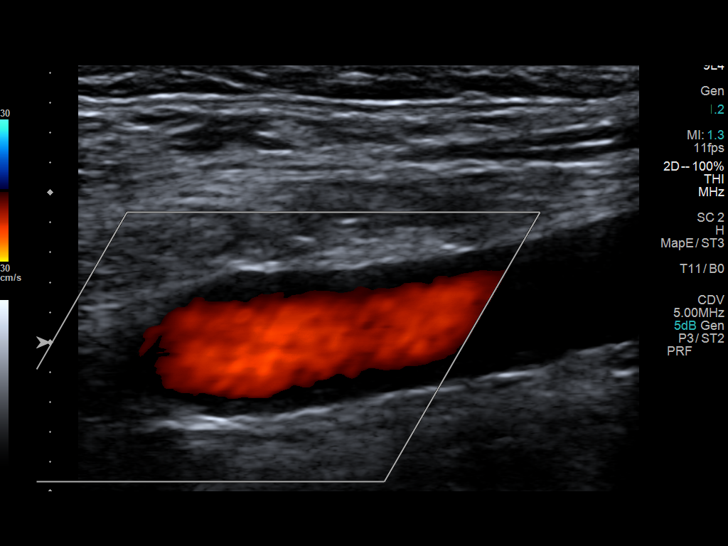
[im 21/53]
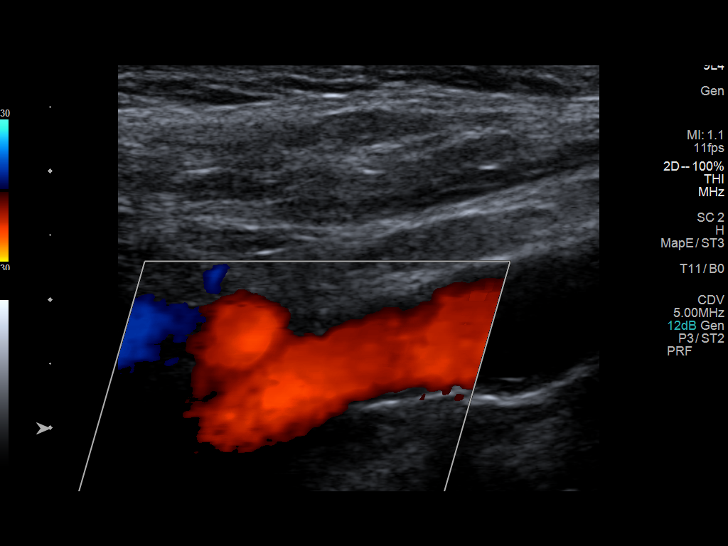
[im 25/53]
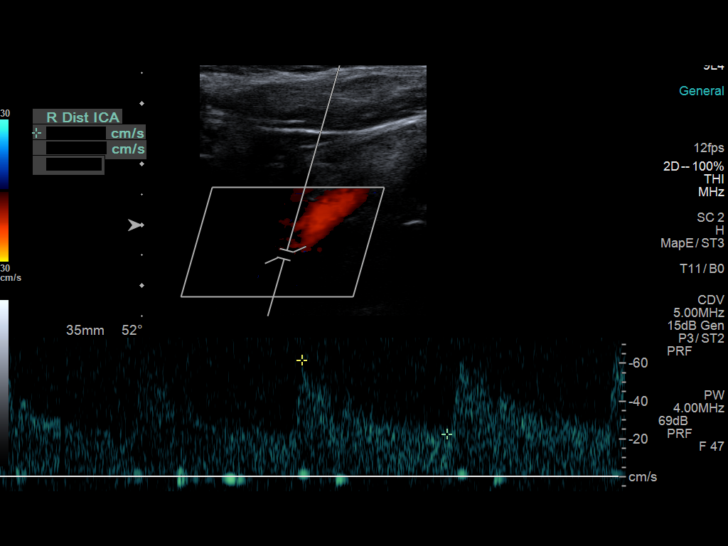
[im 28/53]
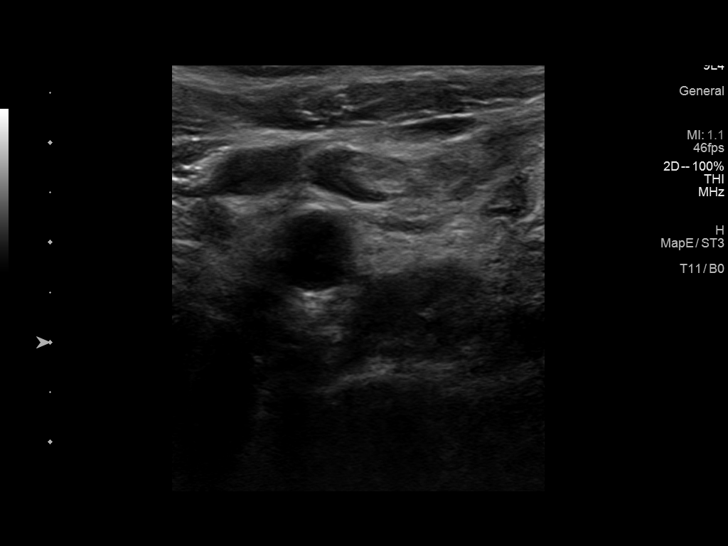
[im 32/53]
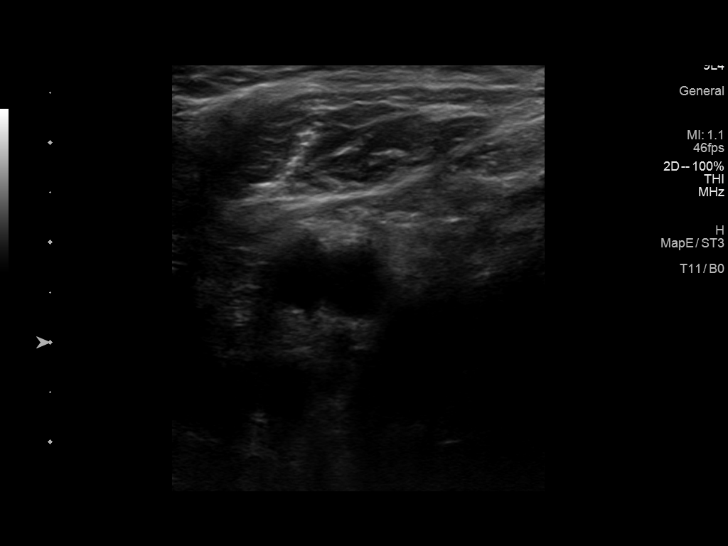
[im 37/53]
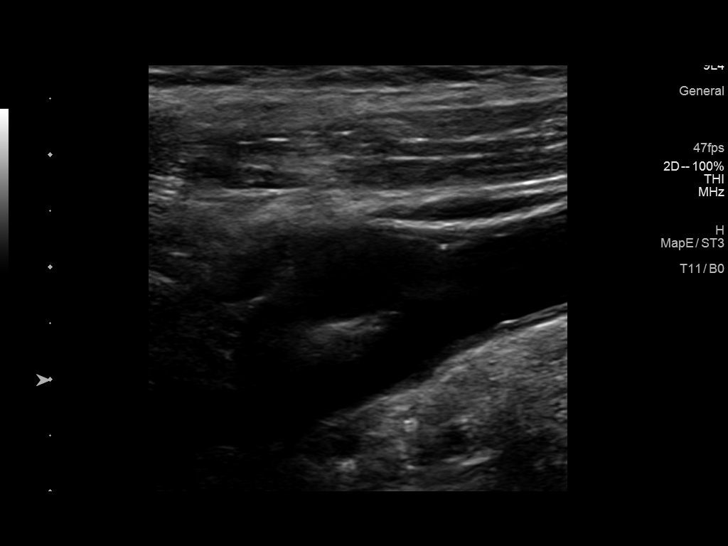
[im 41/53]
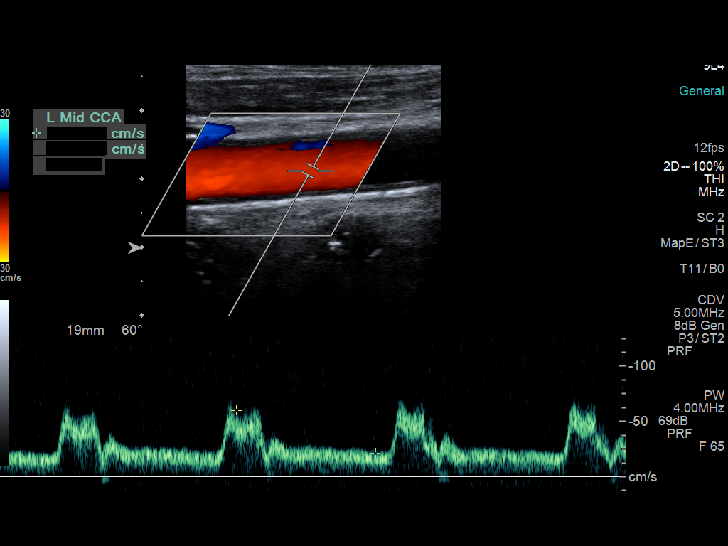
[im 43/53]
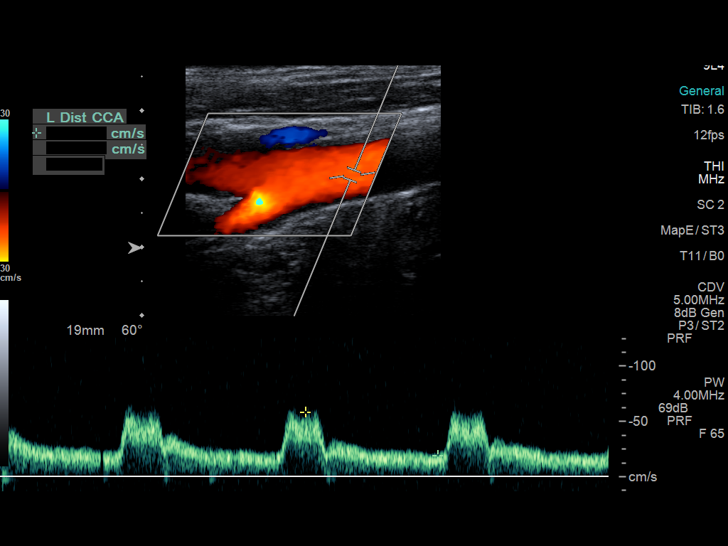
[im 48/53]
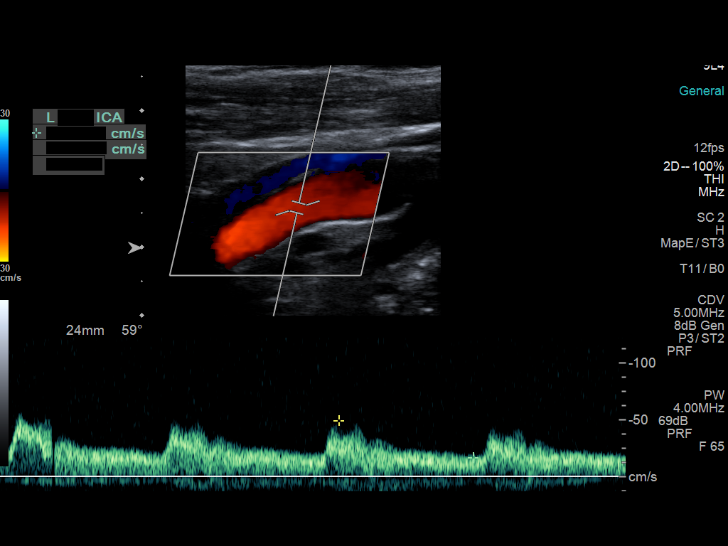
[im 53/53]
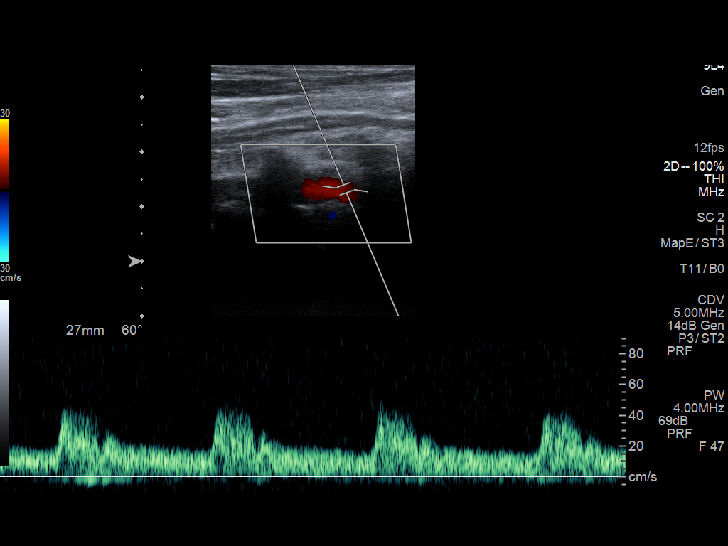

[14 of 24 positions shown; findings below may reference images not displayed]

FINDINGS: Criteria: Quantification of carotid stenosis is based on velocity
parameters that correlate the residual internal carotid diameter
with NASCET-based stenosis levels, using the diameter of the distal
internal carotid lumen as the denominator for stenosis measurement.

The following velocity measurements were obtained:

RIGHT

ICA: 62/23 cm/sec

CCA: 70/15 cm/sec

SYSTOLIC ICA/CCA RATIO:

ECA: 79 cm/sec

LEFT

ICA: 75/28 cm/sec

CCA: 88/25 cm/sec

SYSTOLIC ICA/CCA RATIO:

ECA: 71 cm/sec

RIGHT CAROTID ARTERY: No significant atheromatous plaque.

RIGHT VERTEBRAL ARTERY:  Antegrade flow.

LEFT CAROTID ARTERY:  No significant atheromatous plaque.

LEFT VERTEBRAL ARTERY:  Antegrade flow.
IMPRESSION: No significant stenosis of internal carotid arteries.

## 2022-10-24 ENCOUNTER — Encounter: Payer: Self-pay | Admitting: Dermatology

## 2022-10-24 ENCOUNTER — Ambulatory Visit (INDEPENDENT_AMBULATORY_CARE_PROVIDER_SITE_OTHER): Payer: Medicare Other | Admitting: Dermatology

## 2022-10-24 DIAGNOSIS — L821 Other seborrheic keratosis: Secondary | ICD-10-CM | POA: Diagnosis not present

## 2022-10-24 DIAGNOSIS — L820 Inflamed seborrheic keratosis: Secondary | ICD-10-CM | POA: Diagnosis not present

## 2022-10-24 DIAGNOSIS — C4491 Basal cell carcinoma of skin, unspecified: Secondary | ICD-10-CM

## 2022-10-24 DIAGNOSIS — C44319 Basal cell carcinoma of skin of other parts of face: Secondary | ICD-10-CM

## 2022-10-24 DIAGNOSIS — L578 Other skin changes due to chronic exposure to nonionizing radiation: Secondary | ICD-10-CM | POA: Diagnosis not present

## 2022-10-24 DIAGNOSIS — Z1283 Encounter for screening for malignant neoplasm of skin: Secondary | ICD-10-CM

## 2022-10-24 DIAGNOSIS — D1801 Hemangioma of skin and subcutaneous tissue: Secondary | ICD-10-CM

## 2022-10-24 DIAGNOSIS — W908XXA Exposure to other nonionizing radiation, initial encounter: Secondary | ICD-10-CM | POA: Diagnosis not present

## 2022-10-24 DIAGNOSIS — L814 Other melanin hyperpigmentation: Secondary | ICD-10-CM | POA: Diagnosis not present

## 2022-10-24 DIAGNOSIS — D229 Melanocytic nevi, unspecified: Secondary | ICD-10-CM

## 2022-10-24 DIAGNOSIS — D485 Neoplasm of uncertain behavior of skin: Secondary | ICD-10-CM

## 2022-10-24 HISTORY — DX: Basal cell carcinoma of skin, unspecified: C44.91

## 2022-10-24 NOTE — Progress Notes (Signed)
New Patient Visit   Subjective  Carl Lopez is a 72 y.o. male who presents for the following: FBSE  Patient states he is here today to establish care and for FBSE cancer screening. Patient reports the areas have been there for  Several  year(s).Patient reports he has  He  reports the areas are not bothersome.Patient reports he has not previously been treated for these areas. Patient denies Hx of bx, he did have a sebaceous cyst removed from posterior neck. Patient denies family history of skin cancer(s).    The following portions of the chart were reviewed this encounter and updated as appropriate: medications, allergies, medical history  Review of Systems:  No other skin or systemic complaints except as noted in HPI or Assessment and Plan.  Objective  Well appearing patient in no apparent distress; mood and affect are within normal limits.  A full examination was performed including scalp, head, eyes, ears, nose, lips, neck, chest, axillae, abdomen, back, buttocks, bilateral upper extremities, bilateral lower extremities, hands, feet, fingers, toes, fingernails, and toenails. All findings within normal limits unless otherwise noted below.   Relevant exam findings are noted in the Assessment and Plan.  Right Breast, Right Lower Leg - Posterior Inflamed SEBORRHEIC KERATOSES   Left Malar Cheek 5mm pearly papule  Left Upper Back 7mm Brown Papule dark with speckle          Visit Summary: Carl Lopez presented for his first skin examination, with concerns about various spots on his calf, back, and chest. Physical examination revealed mild actinic damage, rosacea, and multiple skin lesions, including a 4mm pearly papule on the left lower eyelid, seborrheic keratosis, and a 7mm brown papule with dark speckles on the back. Biopsies were performed on the suspicious lesions, and the patient was educated on managing rosacea and the benign nature of seborrheic keratosis. Follow-up was  scheduled to await biopsy results and for annual skin examinations   Scattered LENTIGINES, Inflamed SEBORRHEIC Keratosis, Cherry KERATOSES, ANGIOMAS - Benign normal skin lesions - Benign-appearing - Call for any changes  Benign MELANOCYTIC NEVI - Tan-brown and/or pink-flesh-colored symmetric macules and papules - Benign appearing on exam today - Observation - Call clinic for new or changing moles - Recommend daily use of broad spectrum spf 30+ sunscreen to sun-exposed areas.   ACTINIC DAMAGE - Chronic condition, secondary to cumulative UV/sun exposure - diffuse scaly erythematous macules with underlying dyspigmentation - Recommend daily broad spectrum sunscreen SPF 30+ to sun-exposed areas, reapply every 2 hours as needed.  - Staying in the shade or wearing long sleeves, sun glasses (UVA+UVB protection) and wide brim hats (4-inch brim around the entire circumference of the hat) are also recommended for sun protection.  - Call for new or changing lesions.  SKIN CANCER SCREENING PERFORMED TODAY  Assessment & Plan   Inflamed seborrheic keratosis (2) Right Lower Leg - Posterior; Right Breast  Symptomatic, irritating, patient would like treated.  Benign-appearing.  Call clinic for new or changing lesions.   Prior to procedure, discussed risks of blister formation, small wound, skin dyspigmentation, or rare scar following treatment. Recommend Vaseline ointment to treated areas while healing.   Destruction of lesion - Right Breast, Right Lower Leg - Posterior Complexity: simple   Destruction method: cryotherapy   Informed consent: discussed and consent obtained   Timeout:  patient name, date of birth, surgical site, and procedure verified Lesion destroyed using liquid nitrogen: Yes   Cryotherapy cycles:  2 Outcome: patient tolerated procedure well with  no complications   Post-procedure details: wound care instructions given    Neoplasm of uncertain behavior of skin (2) Left  Malar Cheek  Skin / nail biopsy Type of biopsy: tangential   Informed consent: discussed and consent obtained   Timeout: patient name, date of birth, surgical site, and procedure verified   Procedure prep:  Patient was prepped and draped in usual sterile fashion Prep type:  Isopropyl alcohol Anesthesia: the lesion was anesthetized in a standard fashion   Anesthetic:  1% lidocaine w/ epinephrine 1-100,000 buffered w/ 8.4% NaHCO3 Instrument used: DermaBlade   Hemostasis achieved with: aluminum chloride   Outcome: patient tolerated procedure well   Post-procedure details: sterile dressing applied and wound care instructions given   Dressing type: petrolatum gauze and bandage    Specimen 1 - Surgical pathology Differential Diagnosis: BCC  Check Margins: No  Left Upper Back  Skin / nail biopsy Type of biopsy: tangential   Informed consent: discussed and consent obtained   Timeout: patient name, date of birth, surgical site, and procedure verified   Procedure prep:  Patient was prepped and draped in usual sterile fashion Prep type:  Isopropyl alcohol Anesthesia: the lesion was anesthetized in a standard fashion   Anesthetic:  1% lidocaine w/ epinephrine 1-100,000 buffered w/ 8.4% NaHCO3 Instrument used: DermaBlade   Hemostasis achieved with: aluminum chloride   Outcome: patient tolerated procedure well   Post-procedure details: sterile dressing applied and wound care instructions given   Dressing type: petrolatum gauze and bandage    Specimen 2 - Surgical pathology Differential Diagnosis: Dysplastic Nevus  Check Margins: No   Return in about 1 year (around 10/24/2023) for TBSE F/U.   Documentation: I have reviewed the above documentation for accuracy and completeness, and I agree with the above.  Stasia Cavalier, am acting as scribe for Langston Reusing, DO.  Langston Reusing, DO

## 2022-10-24 NOTE — Patient Instructions (Addendum)

## 2022-10-26 ENCOUNTER — Other Ambulatory Visit: Payer: Self-pay

## 2022-10-26 ENCOUNTER — Telehealth: Payer: Self-pay

## 2022-10-26 DIAGNOSIS — C44319 Basal cell carcinoma of skin of other parts of face: Secondary | ICD-10-CM

## 2022-10-26 NOTE — Telephone Encounter (Signed)
-----   Message from Terri Piedra, DO sent at 10/26/2022  9:28 AM EDT ----- Please call patient and notify that results of one of the biopsies was positive for a skin cancer that needs to be treated with Mohs Surgery.   We will refer to Dr. Jeannine Boga at [facility]   Diagnosis 1. Skin , left malar cheek BASAL CELL CARCINOMA, NODULAR PATTERN 2. Skin , left upper back SEBORRHEIC KERATOSIS, INFLAMED

## 2022-10-26 NOTE — Progress Notes (Signed)
I spoke to him and notified him he is being referee for Mohs surgery with Dr. Jeannine Boga

## 2022-10-26 NOTE — Telephone Encounter (Signed)
I left a message asking him to call back.

## 2022-10-26 NOTE — Progress Notes (Signed)
Please call patient and notify that results of one of the biopsies was positive for a skin cancer that needs to be treated with Mohs Surgery.  We will refer to Dr. Jeannine Boga at [facility]  Diagnosis 1. Skin , left malar cheek BASAL CELL CARCINOMA, NODULAR PATTERN 2. Skin , left upper back SEBORRHEIC KERATOSIS, INFLAMED

## 2022-10-31 ENCOUNTER — Other Ambulatory Visit: Payer: Self-pay

## 2022-10-31 DIAGNOSIS — C44319 Basal cell carcinoma of skin of other parts of face: Secondary | ICD-10-CM

## 2022-11-13 ENCOUNTER — Ambulatory Visit (AMBULATORY_SURGERY_CENTER): Payer: Medicare Other

## 2022-11-13 ENCOUNTER — Encounter: Payer: Self-pay | Admitting: Gastroenterology

## 2022-11-13 ENCOUNTER — Other Ambulatory Visit: Payer: Self-pay

## 2022-11-13 VITALS — Ht 70.0 in | Wt 180.0 lb

## 2022-11-13 DIAGNOSIS — Z86010 Personal history of colonic polyps: Secondary | ICD-10-CM

## 2022-11-13 MED ORDER — NA SULFATE-K SULFATE-MG SULF 17.5-3.13-1.6 GM/177ML PO SOLN
1.0000 | Freq: Once | ORAL | 0 refills | Status: AC
Start: 2022-11-13 — End: 2022-11-13

## 2022-11-13 NOTE — Progress Notes (Signed)
 Denies allergies to eggs or soy products. Denies complication of anesthesia or sedation. Denies use of weight loss medication. Denies use of O2.   Emmi instructions given for colonoscopy.  

## 2022-11-26 ENCOUNTER — Other Ambulatory Visit: Payer: Self-pay | Admitting: Family Medicine

## 2022-11-28 ENCOUNTER — Encounter: Payer: Self-pay | Admitting: Gastroenterology

## 2022-11-28 ENCOUNTER — Ambulatory Visit (AMBULATORY_SURGERY_CENTER): Payer: Medicare Other | Admitting: Gastroenterology

## 2022-11-28 VITALS — BP 126/81 | HR 63 | Temp 97.1°F | Resp 11 | Ht 70.0 in | Wt 180.0 lb

## 2022-11-28 DIAGNOSIS — D125 Benign neoplasm of sigmoid colon: Secondary | ICD-10-CM

## 2022-11-28 DIAGNOSIS — Z86010 Personal history of colonic polyps: Secondary | ICD-10-CM | POA: Diagnosis not present

## 2022-11-28 DIAGNOSIS — D122 Benign neoplasm of ascending colon: Secondary | ICD-10-CM

## 2022-11-28 DIAGNOSIS — Z09 Encounter for follow-up examination after completed treatment for conditions other than malignant neoplasm: Secondary | ICD-10-CM | POA: Diagnosis not present

## 2022-11-28 DIAGNOSIS — E039 Hypothyroidism, unspecified: Secondary | ICD-10-CM | POA: Diagnosis not present

## 2022-11-28 DIAGNOSIS — D124 Benign neoplasm of descending colon: Secondary | ICD-10-CM | POA: Diagnosis not present

## 2022-11-28 DIAGNOSIS — K635 Polyp of colon: Secondary | ICD-10-CM | POA: Diagnosis not present

## 2022-11-28 HISTORY — PX: COLONOSCOPY W/ POLYPECTOMY: SHX1380

## 2022-11-28 MED ORDER — SODIUM CHLORIDE 0.9 % IV SOLN
500.0000 mL | Freq: Once | INTRAVENOUS | Status: DC
Start: 2022-11-28 — End: 2022-11-28

## 2022-11-28 NOTE — Progress Notes (Signed)
Report to PACU, RN, vss, BBS= Clear.  

## 2022-11-28 NOTE — Progress Notes (Signed)
History & Physical  Primary Care Physician:  Shelva Majestic, MD Primary Gastroenterologist: Claudette Head, MD  Impression / Plan:  Personal history of adenomatous colon polyps for surveillance colonoscopy.  CHIEF COMPLAINT: Personal history of colon polyps   HPI: Carl Lopez is a 72 y.o. male with a history of adenomatous colon polyps for surveillance colonoscopy.    Past Medical History:  Diagnosis Date   BACK PAIN 01/26/2009   1987     BCC (basal cell carcinoma of skin) 10/24/2022   left malar cheek   Chronic kidney disease    kidney stone 2017   DIVERTICULOSIS, COLON 07/29/2007   Hyperlipidemia    Thyroid disease    72years old, meds 2-3 years    Past Surgical History:  Procedure Laterality Date   CHOLECYSTECTOMY  1988   COLONOSCOPY     INCISIONAL HERNIA REPAIR  1988   INGUINAL HERNIA REPAIR  05-31-10   POLYPECTOMY      Prior to Admission medications   Medication Sig Start Date End Date Taking? Authorizing Provider  aspirin EC 81 MG tablet Take 81 mg by mouth daily. Swallow whole.   Yes [provider]  atorvastatin (LIPITOR) 40 MG tablet TAKE 1 TABLET BY MOUTH DAILY 07/03/22  Yes Shelva Majestic, MD  levothyroxine (SYNTHROID) 137 MCG tablet TAKE 1 TABLET BY MOUTH DAILY  BEFORE BREAKFAST 02/06/22  Yes Shelva Majestic, MD  Multiple Vitamins-Minerals (PRESERVISION AREDS 2 PO) Take 1 tablet by mouth daily.   Yes [provider]  cyanocobalamin (VITAMIN B12) 1000 MCG/ML injection INJECT 1 ML INTO THE SKIN ONCE FOR 4 WEEKS THEN 1 ML EVERY MONTH 11/27/22   Shelva Majestic, MD  ferrous sulfate 325 (65 FE) MG tablet Take 325 mg by mouth once a week.    [provider]  tadalafil (CIALIS) 20 MG tablet Take 1 tablet (20 mg total) by mouth daily as needed for erectile dysfunction. 08/07/22   Shelva Majestic, MD    Current Outpatient Medications  Medication Sig Dispense Refill   aspirin EC 81 MG tablet Take 81 mg by mouth daily. Swallow  whole.     atorvastatin (LIPITOR) 40 MG tablet TAKE 1 TABLET BY MOUTH DAILY 100 tablet 2   levothyroxine (SYNTHROID) 137 MCG tablet TAKE 1 TABLET BY MOUTH DAILY  BEFORE BREAKFAST 100 tablet 2   Multiple Vitamins-Minerals (PRESERVISION AREDS 2 PO) Take 1 tablet by mouth daily.     cyanocobalamin (VITAMIN B12) 1000 MCG/ML injection INJECT 1 ML INTO THE SKIN ONCE FOR 4 WEEKS THEN 1 ML EVERY MONTH 6 mL 2   ferrous sulfate 325 (65 FE) MG tablet Take 325 mg by mouth once a week.     tadalafil (CIALIS) 20 MG tablet Take 1 tablet (20 mg total) by mouth daily as needed for erectile dysfunction. 10 tablet 5   Current Facility-Administered Medications  Medication Dose Route Frequency Provider Last Rate Last Admin   0.9 %  sodium chloride infusion  500 mL Intravenous Once Meryl Dare, MD        Allergies as of 11/28/2022 - Review Complete 11/28/2022  Allergen Reaction Noted   Penicillins Swelling and Other (See Comments) 06/19/2015    Family History  Problem Relation Age of Onset   Colon cancer Maternal Uncle    Colon cancer Other    Breast cancer Mother        56   Dementia Father        58  CVA Father    Breast cancer Maternal Grandmother    Diabetes Maternal Grandfather    Kidney disease Paternal Grandfather    Esophageal cancer Neg Hx    Rectal cancer Neg Hx    Stomach cancer Neg Hx     Social History   Socioeconomic History   Marital status: Married    Spouse name: Not on file   Number of children: 0   Years of education: Not on file   Highest education level: Not on file  Occupational History   Not on file  Tobacco Use   Smoking status: Never    Passive exposure: Never   Smokeless tobacco: Never  Vaping Use   Vaping status: Never Used  Substance and Sexual Activity   Alcohol use: Yes    Alcohol/week: 2.0 - 4.0 standard drinks of alcohol    Types: 2 - 4 Glasses of wine per week    Comment: socially   Drug use: No   Sexual activity: Yes  Other Topics Concern    Not on file  Social History Narrative   Married 26 years in 2015. No kids. Retired from [employer], baggage handler/union repHobbies: Presenter, broadcasting, music, movies, reading      Right Handed    Lives in a one story home    Social Determinants of Health   Financial Resource Strain: Low Risk  (10/19/2022)   Overall Financial Resource Strain (CARDIA)    Difficulty of Paying Living Expenses: Not hard at all  Food Insecurity: No Food Insecurity (10/15/2022)   Hunger Vital Sign    Worried About Running Out of Food in the Last Year: Never true    Ran Out of Food in the Last Year: Never true  Transportation Needs: No Transportation Needs (10/15/2022)   PRAPARE - Administrator, Civil Service (Medical): No    Lack of Transportation (Non-Medical): No  Physical Activity: Sufficiently Active (10/15/2022)   Exercise Vital Sign    Days of Exercise per Week: 2 days    Minutes of Exercise per Session: 120 min  Stress: No Stress Concern Present (10/15/2022)   [employer] of Occupational Health - Occupational Stress Questionnaire    Feeling of Stress : Only a little  Social Connections: Socially Integrated (10/15/2022)   Social Connection and Isolation Panel [NHANES]    Frequency of Communication with Friends and Family: Three times a week    Frequency of Social Gatherings with Friends and Family: Once a week    Attends Religious Services: More than 4 times per year    Active Member of [employer] or Organizations: Yes    Attends Banker Meetings: Patient declined    Marital Status: Married  Catering manager Violence: Not At Risk (10/19/2022)   Humiliation, Afraid, Rape, and Kick questionnaire    Fear of Current or Ex-Partner: No    Emotionally Abused: No    Physically Abused: No    Sexually Abused: No    Review of Systems:  All systems reviewed were negative except where noted in HPI.   Physical Exam:    General:  Alert, well-developed, in NAD Head:  Normocephalic and  atraumatic. Eyes:  Sclera clear, no icterus.   Conjunctiva pink. Ears:  Normal auditory acuity. Mouth:  No deformity or lesions.  Neck:  Supple; no masses. Lungs:  Clear throughout to auscultation.   No wheezes, crackles, or rhonchi.  Heart:  Regular rate and rhythm; no murmurs. Abdomen:  Soft, nondistended, nontender. No masses, hepatomegaly.  No palpable masses.  Normal bowel sounds.    Rectal:  Deferred   Msk:  Symmetrical without gross deformities. Extremities:  Without edema. Neurologic:  Alert and  oriented x 4; grossly normal neurologically. Skin:  Intact without significant lesions or rashes. Psych:  Alert and cooperative. Normal mood and affect.   Venita Lick. Russella Dar  11/28/2022, 3:22 PM See Loretha Stapler, [facility], to contact our on call provider

## 2022-11-28 NOTE — Progress Notes (Signed)
VS completed by EP   Pt's states no medical or surgical changes since previsit or office visit.

## 2022-11-28 NOTE — Patient Instructions (Signed)
Discharge instructions given. Handouts on polyps,diverticulosis and hemorrhoids. Resume previous medications. YOU HAD AN ENDOSCOPIC PROCEDURE TODAY AT THE [facility]:   Refer to the procedure report that was given to you for any specific questions about what was found during the examination.  If the procedure report does not answer your questions, please call your gastroenterologist to clarify.  If you requested that your care partner not be given the details of your procedure findings, then the procedure report has been included in a sealed envelope for you to review at your convenience later.  YOU SHOULD EXPECT: Some feelings of bloating in the abdomen. Passage of more gas than usual.  Walking can help get rid of the air that was put into your GI tract during the procedure and reduce the bloating. If you had a lower endoscopy (such as a colonoscopy or flexible sigmoidoscopy) you may notice spotting of blood in your stool or on the toilet paper. If you underwent a bowel prep for your procedure, you may not have a normal bowel movement for a few days.  Please Note:  You might notice some irritation and congestion in your nose or some drainage.  This is from the oxygen used during your procedure.  There is no need for concern and it should clear up in a day or so.  SYMPTOMS TO REPORT IMMEDIATELY:  Following lower endoscopy (colonoscopy or flexible sigmoidoscopy):  Excessive amounts of blood in the stool  Significant tenderness or worsening of abdominal pains  Swelling of the abdomen that is new, acute  Fever of 100F or higher   For urgent or emergent issues, a gastroenterologist can be reached at any hour by calling [phone]. Do not use MyChart messaging for urgent concerns.    DIET:  We do recommend a small meal at first, but then you may proceed to your regular diet.  Drink plenty of fluids but you should avoid alcoholic beverages for 24 hours.  ACTIVITY:  You should  plan to take it easy for the rest of today and you should NOT DRIVE or use heavy machinery until tomorrow (because of the sedation medicines used during the test).    FOLLOW UP: Our staff will call the number listed on your records the next business day following your procedure.  We will call around 7:15- 8:00 am to check on you and address any questions or concerns that you may have regarding the information given to you following your procedure. If we do not reach you, we will leave a message.     If any biopsies were taken you will be contacted by phone or by letter within the next 1-3 weeks.  Please call us at [phone] if you have not heard about the biopsies in 3 weeks.    SIGNATURES/CONFIDENTIALITY: You and/or your care partner have signed paperwork which will be entered into your electronic medical record.  These signatures attest to the fact that that the information above on your After Visit Summary has been reviewed and is understood.  Full responsibility of the confidentiality of this discharge information lies with you and/or your care-partner. 

## 2022-11-28 NOTE — Progress Notes (Signed)
Called to room to assist during endoscopic procedure.  Patient ID and intended procedure confirmed with present staff. Received instructions for my participation in the procedure from the performing physician.  

## 2022-11-28 NOTE — Op Note (Signed)
[facility] Patient Name: Carl Lopez Procedure Date: 11/28/2022 3:15 PM MRN: [id number] Endoscopist: Meryl Dare , MD, [phone] Age: 72 Referring MD:  Date of Birth: 29-Jan-1951 Gender: Male Account #: [id number] Procedure:                Colonoscopy Indications:              Surveillance: Personal history of adenomatous                            polyps on last colonoscopy > 5 years ago Medicines:                Monitored Anesthesia Care Procedure:                Pre-Anesthesia Assessment:                           - Prior to the procedure, a History and Physical                            was performed, and patient medications and                            allergies were reviewed. The patient's tolerance of                            previous anesthesia was also reviewed. The risks                            and benefits of the procedure and the sedation                            options and risks were discussed with the patient.                            All questions were answered, and informed consent                            was obtained. Prior Anticoagulants: The patient has                            taken no anticoagulant or antiplatelet agents. ASA                            Grade Assessment: II - A patient with mild systemic                            disease. After reviewing the risks and benefits,                            the patient was deemed in satisfactory condition to                            undergo the procedure.  After obtaining informed consent, the colonoscope                            was passed under direct vision. Throughout the                            procedure, the patient's blood pressure, pulse, and                            oxygen saturations were monitored continuously. The                            CF [id number] #[id number] was introduced through the anus                            and advanced to the  the cecum, identified by                            appendiceal orifice and ileocecal valve. The                            ileocecal valve, appendiceal orifice, and rectum                            were photographed. The quality of the bowel                            preparation was good. The colonoscopy was somewhat                            difficult due to a redundant colon, significant                            looping and a tortuous colon. The patient tolerated                            the procedure well. Scope In: 3:34:18 PM Scope Out: 4:03:01 PM Scope Withdrawal Time: 0 hours 17 minutes 24 seconds  Total Procedure Duration: 0 hours 28 minutes 43 seconds  Findings:                 The perianal and digital rectal examinations were                            normal.                           Three sessile polyps were found in the sigmoid                            colon, descending colon and ascending colon. The                            polyps were 6 to 7 mm in size. These polyps were  removed with a cold snare. Resection and retrieval                            were complete.                           Multiple medium-mouthed diverticula were found in                            the entire colon. There was no evidence of                            diverticular bleeding.                           Internal hemorrhoids were found during                            retroflexion. The hemorrhoids were small and Grade                            I (internal hemorrhoids that do not prolapse).                           The exam was otherwise without abnormality on                            direct and retroflexion views. Complications:            No immediate complications. Estimated blood loss:                            None. Estimated Blood Loss:     Estimated blood loss: none. Impression:               - Three 6 to 7 mm polyps in the sigmoid colon, in                             the descending colon and in the ascending colon,                            removed with a cold snare. Resected and retrieved.                           - Moderate diverticulosis in the entire examined                            colon.                           - Internal hemorrhoids.                           - The examination was otherwise normal on direct  and retroflexion views. Recommendation:           - Consider repeat colonoscopy vs no repeat due to                            age after studies are complete for surveillance                            based on pathology results.                           - Patient has a contact number available for                            emergencies. The signs and symptoms of potential                            delayed complications were discussed with the                            patient. Return to normal activities tomorrow.                            Written discharge instructions were provided to the                            patient.                           - High fiber diet.                           - Continue present medications.                           - Await pathology results. Meryl Dare, MD 11/28/2022 4:10:39 PM This report has been signed electronically.

## 2022-11-29 ENCOUNTER — Telehealth: Payer: Self-pay

## 2022-11-29 DIAGNOSIS — C44319 Basal cell carcinoma of skin of other parts of face: Secondary | ICD-10-CM | POA: Diagnosis not present

## 2022-11-29 DIAGNOSIS — L738 Other specified follicular disorders: Secondary | ICD-10-CM | POA: Diagnosis not present

## 2022-11-29 NOTE — Telephone Encounter (Signed)
Left message on answering machine. 

## 2022-12-08 ENCOUNTER — Encounter: Payer: Self-pay | Admitting: Gastroenterology

## 2022-12-17 ENCOUNTER — Other Ambulatory Visit: Payer: Self-pay | Admitting: Family Medicine

## 2023-01-11 ENCOUNTER — Telehealth: Payer: Self-pay | Admitting: Family Medicine

## 2023-01-11 NOTE — Telephone Encounter (Signed)
J.P. from [facility] called asked PCP if it is okay to switch the manufacturer of patient's levothyroxine from Amneal to Lupin. JP can be reached @ [phone] with the reference # [id number]

## 2023-01-12 ENCOUNTER — Encounter: Payer: Self-pay | Admitting: Family Medicine

## 2023-01-12 NOTE — Telephone Encounter (Signed)
Called and spoke with pharmacy and provided the OK for the switch.

## 2023-03-17 ENCOUNTER — Other Ambulatory Visit: Payer: Self-pay | Admitting: Family Medicine

## 2023-07-26 DIAGNOSIS — H35363 Drusen (degenerative) of macula, bilateral: Secondary | ICD-10-CM | POA: Diagnosis not present

## 2023-07-26 DIAGNOSIS — H40013 Open angle with borderline findings, low risk, bilateral: Secondary | ICD-10-CM | POA: Diagnosis not present

## 2023-07-26 DIAGNOSIS — H40053 Ocular hypertension, bilateral: Secondary | ICD-10-CM | POA: Diagnosis not present

## 2023-08-08 ENCOUNTER — Ambulatory Visit (INDEPENDENT_AMBULATORY_CARE_PROVIDER_SITE_OTHER): Payer: Medicare Other | Admitting: Family Medicine

## 2023-08-08 ENCOUNTER — Encounter: Payer: Self-pay | Admitting: Family Medicine

## 2023-08-08 VITALS — BP 102/60 | HR 84 | Temp 97.4°F | Ht 70.0 in | Wt 175.2 lb

## 2023-08-08 DIAGNOSIS — Z125 Encounter for screening for malignant neoplasm of prostate: Secondary | ICD-10-CM

## 2023-08-08 DIAGNOSIS — R739 Hyperglycemia, unspecified: Secondary | ICD-10-CM

## 2023-08-08 DIAGNOSIS — Z131 Encounter for screening for diabetes mellitus: Secondary | ICD-10-CM | POA: Diagnosis not present

## 2023-08-08 DIAGNOSIS — E785 Hyperlipidemia, unspecified: Secondary | ICD-10-CM | POA: Diagnosis not present

## 2023-08-08 DIAGNOSIS — E039 Hypothyroidism, unspecified: Secondary | ICD-10-CM | POA: Diagnosis not present

## 2023-08-08 DIAGNOSIS — E538 Deficiency of other specified B group vitamins: Secondary | ICD-10-CM

## 2023-08-08 DIAGNOSIS — Z0000 Encounter for general adult medical examination without abnormal findings: Secondary | ICD-10-CM

## 2023-08-08 LAB — COMPREHENSIVE METABOLIC PANEL WITH GFR
ALT: 11 U/L (ref 0–53)
AST: 15 U/L (ref 0–37)
Albumin: 4.4 g/dL (ref 3.5–5.2)
Alkaline Phosphatase: 84 U/L (ref 39–117)
BUN: 16 mg/dL (ref 6–23)
CO2: 29 meq/L (ref 19–32)
Calcium: 9.1 mg/dL (ref 8.4–10.5)
Chloride: 105 meq/L (ref 96–112)
Creatinine, Ser: 0.95 mg/dL (ref 0.40–1.50)
GFR: 78.59 mL/min (ref >60.00)
Glucose, Bld: 113 mg/dL — ABNORMAL HIGH (ref 70–99)
Potassium: 4 meq/L (ref 3.5–5.1)
Sodium: 141 meq/L (ref 135–145)
Total Bilirubin: 1.2 mg/dL (ref 0.2–1.2)
Total Protein: 6.4 g/dL (ref 6.0–8.3)

## 2023-08-08 LAB — LIPID PANEL
Cholesterol: 164 mg/dL (ref 0–200)
HDL: 57.4 mg/dL (ref >39.00)
LDL Cholesterol: 82 mg/dL (ref 0–99)
NonHDL: 106.9
Total CHOL/HDL Ratio: 3
Triglycerides: 124 mg/dL (ref 0.0–149.0)
VLDL: 24.8 mg/dL (ref 0.0–40.0)

## 2023-08-08 LAB — CBC WITH DIFFERENTIAL/PLATELET
Basophils Absolute: 0 10*3/uL (ref 0.0–0.1)
Basophils Relative: 0.7 % (ref 0.0–3.0)
Eosinophils Absolute: 0 10*3/uL (ref 0.0–0.7)
Eosinophils Relative: 0.6 % (ref 0.0–5.0)
HCT: 45.6 % (ref 39.0–52.0)
Hemoglobin: 15.9 g/dL (ref 13.0–17.0)
Lymphocytes Relative: 15.1 % (ref 12.0–46.0)
Lymphs Abs: 0.8 10*3/uL (ref 0.7–4.0)
MCHC: 34.7 g/dL (ref 30.0–36.0)
MCV: 93.5 fl (ref 78.0–100.0)
Monocytes Absolute: 0.4 10*3/uL (ref 0.1–1.0)
Monocytes Relative: 7.5 % (ref 3.0–12.0)
Neutro Abs: 4 10*3/uL (ref 1.4–7.7)
Neutrophils Relative %: 76.1 % (ref 43.0–77.0)
Platelets: 234 10*3/uL (ref 150.0–400.0)
RBC: 4.88 Mil/uL (ref 4.22–5.81)
RDW: 13 % (ref 11.5–15.5)
WBC: 5.2 10*3/uL (ref 4.0–10.5)

## 2023-08-08 LAB — TSH: TSH: 0.22 u[IU]/mL — ABNORMAL LOW (ref 0.35–5.50)

## 2023-08-08 LAB — VITAMIN B12: Vitamin B-12: 348 pg/mL (ref 211–911)

## 2023-08-08 LAB — HEMOGLOBIN A1C: Hgb A1c MFr Bld: 5.6 % (ref 4.6–6.5)

## 2023-08-08 LAB — PSA, MEDICARE: PSA: 2.28 ng/mL (ref 0.10–4.00)

## 2023-08-08 NOTE — Progress Notes (Signed)
 Phone: [phone]   Subjective:  Patient presents today for their annual physical. Chief complaint-noted.   See problem oriented charting- ROS- full  review of systems was completed and negative  except for: stress from caring from wife and her lung cancer  The following were reviewed and entered/updated in epic: Past Medical History:  Diagnosis Date   Allergy    BACK PAIN 01/26/2009   1987     BCC (basal cell carcinoma of skin) 10/24/2022   left malar cheek MOHS on 11/29/22   Cataract    Chronic kidney disease    kidney stone 2017   DIVERTICULOSIS, COLON 07/29/2007   Hyperlipidemia    Thyroid disease    73years old, meds 2-3 years   Patient Active Problem List   Diagnosis Date Noted   B12 deficiency 08/03/2021    Priority: Medium    Hypothyroidism 07/29/2007    Priority: Medium    Hyperlipidemia 07/29/2007    Priority: Medium    Nephrolithiasis 06/30/2015    Priority: Low   Skin lesion 06/29/2014    Priority: Low   Hyperglycemia 06/29/2014    Priority: Low   Abdominal mass 01/12/2014    Priority: Low   SNORING 08/03/2008    Priority: Low   History of colonic polyps 07/29/2007    Priority: Low   Past Surgical History:  Procedure Laterality Date   CHOLECYSTECTOMY  05/01/1986   COLONOSCOPY  2017   COLONOSCOPY W/ POLYPECTOMY  11/28/2022   [facility] HERNIA REPAIR  05/01/1986   INGUINAL HERNIA REPAIR  05/31/2010   POLYPECTOMY      Family History  Problem Relation Age of Onset   Colon cancer Maternal Uncle    Colon cancer Other    Breast cancer Mother        54   Dementia Father        2   CVA Father    Breast cancer Maternal Grandmother    Diabetes Maternal Grandfather    Kidney disease Paternal Grandfather    Esophageal cancer Neg Hx    Rectal cancer Neg Hx    Stomach cancer Neg Hx     Medications- reviewed and updated Current Outpatient Medications  Medication Sig Dispense Refill   aspirin EC 81 MG tablet Take 81 mg  by mouth daily. Swallow whole.     atorvastatin (LIPITOR) 40 MG tablet TAKE 1 TABLET BY MOUTH DAILY 100 tablet 2   cyanocobalamin (VITAMIN B12) 1000 MCG/ML injection INJECT 1 ML INTO THE SKIN ONCE FOR 4 WEEKS THEN 1 ML EVERY MONTH 6 mL 2   ferrous sulfate 325 (65 FE) MG tablet Take 325 mg by mouth once a week.     levothyroxine (SYNTHROID) 137 MCG tablet TAKE 1 TABLET BY MOUTH DAILY  BEFORE BREAKFAST 100 tablet 2   Multiple Vitamins-Minerals (PRESERVISION AREDS 2 PO) Take 1 tablet by mouth daily.     tadalafil (CIALIS) 20 MG tablet Take 1 tablet (20 mg total) by mouth daily as needed for erectile dysfunction. 10 tablet 5   No current facility-administered medications for this visit.    Allergies-reviewed and updated Allergies  Allergen Reactions   Penicillins Swelling and Other (See Comments)    Unknown Has patient had a PCN reaction causing immediate rash, facial/tongue/throat swelling, SOB or lightheadedness with hypotension:  unknown  Has patient had a PCN reaction causing severe rash involving mucus membranes or skin necrosis: unknown Has patient had a PCN reaction that required hospitalization :  unknown,  Has patient had a PCN reaction occurring within the last 10 years: No Childhood allergy, swelling of arm after injection of penicillin    Social History   Social History Narrative   Married 26 years in 2015. No kids. Retired from [employer], Electronics engineer handler/union repHobbies: Presenter, broadcasting, music, movies, reading      Right Handed    Lives in a one story home    Objective  Objective:  BP 102/60   Pulse 84   Temp (!) 97.4 F (36.3 C)   Ht 5\' 10"  (1.778 m)   Wt 175 lb 3.2 oz (79.5 kg)   SpO2 98%   BMI 25.14 kg/m  Gen: NAD, resting comfortably HEENT: Mucous membranes are moist. Oropharynx normal Neck: no thyromegaly CV: RRR no murmurs rubs or gallops Lungs: CTAB no crackles, wheeze, rhonchi Abdomen: soft/nontender/nondistended/normal bowel sounds. No rebound or guarding.   Ext: no edema Skin: warm, dry Neuro: grossly normal, moves all extremities, PERRLA Declines genitourinary and rectal exam    Assessment and Plan  73 y.o. male presenting for annual physical.  Health Maintenance counseling: 1. Anticipatory guidance: Patient counseled regarding regular dental exams -q6 months, eye exams - just had yearly visit,  avoiding smoking and second hand smoke , limiting alcohol to 2 beverages per day - 5 a week or less, no illicit drugs .   2. Risk factor reduction:  Advised patient of need for regular exercise and diet rich and fruits and vegetables to reduce risk of heart attack and stroke.  Exercise- getting more active as weather improved.  Diet/weight management-weight down 6 lbs in last year- wife isn't eating as much so he is eating less as result.  Wt Readings from Last 3 Encounters:  08/08/23 175 lb 3.2 oz (79.5 kg)  11/28/22 180 lb (81.6 kg)  11/13/22 180 lb (81.6 kg)   3. Immunizations/screenings/ancillary studies- leans toward annual COVID. We opted out of Prevnar 20 Immunization History  Administered Date(s) Administered   Fluad Quad(high Dose 65+) 02/05/2019, 02/02/2021, 02/13/2022   Influenza Split 02/27/2011, 02/09/2012   Influenza Whole 01/26/2009, 02/03/2010   Influenza, High Dose Seasonal PF 02/09/2016, 01/31/2017, 02/05/2018   Influenza,inj,Quad PF,6+ Mos 01/31/2013, 12/29/2013, 12/28/2014   Influenza-Unspecified 02/17/2020   Moderna Covid-19 Fall Seasonal Vaccine 17yrs & older 02/17/2023   PFIZER Comirnaty(Gray Top)Covid-19 Tri-Sucrose Vaccine 02/13/2022   PFIZER(Purple Top)SARS-COV-2 Vaccination 05/21/2019, 06/11/2019, 01/28/2020, 09/28/2020   Pfizer Covid-19 Vaccine Bivalent Booster 29yrs & up 03/01/2021   Pneumococcal Conjugate-13 06/30/2015   Pneumococcal Polysaccharide-23 12/29/2013   Td 06/29/2005, 08/13/2017   Zoster Recombinant(Shingrix) 07/02/2019, 10/14/2019   Zoster, Live 11/27/2011  4. Prostate cancer screening- prefers to  continue PSA checks with overall good health  Lab Results  Component Value Date   PSA 0.87 08/07/2022   PSA 0.66 08/03/2021   PSA 1.42 11/05/2019   5. Colon cancer screening - colonoscopy 11/28/2022 with 3 year repeat per Dr. Russella Dar 6. Skin cancer screening- saw Dr. Onalee Hua  in 2024 - already scheduled in June for recheck. advised regular sunscreen use. Denies worrisome, changing, or new skin lesions.  7. Smoking associated screening (lung cancer screening, AAA screen 65-75, UA)- never smoker- 8. STD screening - only active with wife  Status of chronic or acute concerns   #Social update- wife with lung cancer diagnosed about a year ago- picked up off CT calcium scoring- surgery to remove upper right and section of lower- radiation was very very challenging. Had to be taken off a medication but still losing  weight and very debilitated- tough last 6 months.   #hyperlipidemia # Questionable history of TIA-remains on aspirin 81 mg-the symptoms may have been related to low B12-previously seen by Dr. Eliane Decree: Medication:Atorvastatin 40 mg  Lab Results  Component Value Date   CHOL 134 08/07/2022   HDL 45.10 08/07/2022   LDLCALC 63 08/07/2022   LDLDIRECT 82.0 06/13/2021   TRIG 129.0 08/07/2022   CHOLHDL 3 08/07/2022   A/P: hopefully stable- update lipid panel today. Continue current meds for now    #hypothyroidism S: compliant On thyroid medication-levothyroxine 137 mcg  Lab Results  Component Value Date   TSH 1.73 08/07/2022  A/P:hopefully stable- update tsh today. Continue current meds for now     # B12 deficiency-history of macrocytosis related to this S: Current treatment/medication (oral vs. IM): Monthly B12 injections at home IM A/P: hopefully stable- update b12 today. Continue current meds for now    # Erectile dysfunction-tadalafil helpful but not as much opportunity with wfie ill   # Hyperglycemia/insulin resistance/prediabetesas high as 5.7 in past- but looked better 2023 and  beyond   Recommended follow up: Return in about 1 year (around 08/07/2024) for physical or sooner if needed.Schedule b4 you leave. Future Appointments  Date Time Provider Department Center  10/25/2023 10:45 AM Terri Piedra, DO [facility] None   Lab/Order associations: fasting   ICD-10-CM   1. Preventative health care  Z00.00     2. Screening for diabetes mellitus  Z13.1 Hemoglobin A1c    3. Hyperglycemia  R73.9 Hemoglobin A1c    4. Screening for prostate cancer  Z12.5 PSA, Medicare    5. Hypothyroidism, unspecified type  E03.9 TSH    6. Hyperlipidemia, unspecified hyperlipidemia type  E78.5 Comprehensive metabolic panel with GFR    CBC with Differential/Platelet    Lipid panel    7. B12 deficiency  E53.8 Vitamin B12     No orders of the defined types were placed in this encounter.  Return precautions advised.  Tana Conch, MD

## 2023-08-08 NOTE — Patient Instructions (Addendum)
 Please stop by lab before you go If you have mychart- we will send your results within 3 business days of [place] receiving them.  If you do not have mychart- we will call you about results within 5 business days of [place] receiving them.  *please also note that you will see labs on mychart as soon as they post. I will later go in and write notes on them- will say "notes from Dr. Durene Cal"   Recommended follow up: Return in about 1 year (around 08/07/2024) for physical or sooner if needed.Schedule b4 you leave.

## 2023-08-09 ENCOUNTER — Encounter: Payer: Self-pay | Admitting: Family Medicine

## 2023-08-10 ENCOUNTER — Other Ambulatory Visit: Payer: Self-pay

## 2023-08-10 DIAGNOSIS — E039 Hypothyroidism, unspecified: Secondary | ICD-10-CM

## 2023-08-10 DIAGNOSIS — R972 Elevated prostate specific antigen [PSA]: Secondary | ICD-10-CM

## 2023-08-16 ENCOUNTER — Other Ambulatory Visit: Payer: Self-pay | Admitting: *Deleted

## 2023-08-16 MED ORDER — LEVOTHYROXINE SODIUM 125 MCG PO TABS: 125.0000 ug | ORAL_TABLET | Freq: Every day | ORAL | 3 refills | Status: DC

## 2023-08-27 DIAGNOSIS — H40013 Open angle with borderline findings, low risk, bilateral: Secondary | ICD-10-CM | POA: Diagnosis not present

## 2023-08-27 DIAGNOSIS — H40053 Ocular hypertension, bilateral: Secondary | ICD-10-CM | POA: Diagnosis not present

## 2023-09-21 ENCOUNTER — Encounter: Payer: Self-pay | Admitting: Family Medicine

## 2023-09-21 ENCOUNTER — Other Ambulatory Visit (INDEPENDENT_AMBULATORY_CARE_PROVIDER_SITE_OTHER)

## 2023-09-21 ENCOUNTER — Ambulatory Visit: Payer: Self-pay | Admitting: Family Medicine

## 2023-09-21 DIAGNOSIS — E039 Hypothyroidism, unspecified: Secondary | ICD-10-CM | POA: Diagnosis not present

## 2023-09-21 LAB — TSH: TSH: 0.6 u[IU]/mL (ref 0.35–5.50)

## 2023-09-25 ENCOUNTER — Other Ambulatory Visit: Payer: Self-pay

## 2023-09-25 DIAGNOSIS — R972 Elevated prostate specific antigen [PSA]: Secondary | ICD-10-CM

## 2023-09-26 ENCOUNTER — Other Ambulatory Visit: Payer: Self-pay

## 2023-09-26 MED ORDER — LEVOTHYROXINE SODIUM 137 MCG PO TABS: 137.0000 ug | ORAL_TABLET | Freq: Every day | ORAL | 3 refills | Status: AC

## 2023-10-03 ENCOUNTER — Ambulatory Visit: Payer: Self-pay | Admitting: Family Medicine

## 2023-10-03 ENCOUNTER — Other Ambulatory Visit (INDEPENDENT_AMBULATORY_CARE_PROVIDER_SITE_OTHER)

## 2023-10-03 DIAGNOSIS — R972 Elevated prostate specific antigen [PSA]: Secondary | ICD-10-CM | POA: Diagnosis not present

## 2023-10-03 LAB — PSA: PSA: 1.12 ng/mL (ref 0.10–4.00)

## 2023-10-24 ENCOUNTER — Ambulatory Visit: Payer: Medicare Other | Admitting: Dermatology

## 2023-10-25 ENCOUNTER — Encounter: Payer: Self-pay | Admitting: Dermatology

## 2023-10-25 ENCOUNTER — Ambulatory Visit: Payer: Medicare Other | Admitting: Dermatology

## 2023-10-25 VITALS — BP 138/77 | HR 75

## 2023-10-25 DIAGNOSIS — L821 Other seborrheic keratosis: Secondary | ICD-10-CM

## 2023-10-25 DIAGNOSIS — L578 Other skin changes due to chronic exposure to nonionizing radiation: Secondary | ICD-10-CM

## 2023-10-25 DIAGNOSIS — Z1283 Encounter for screening for malignant neoplasm of skin: Secondary | ICD-10-CM

## 2023-10-25 DIAGNOSIS — L814 Other melanin hyperpigmentation: Secondary | ICD-10-CM

## 2023-10-25 DIAGNOSIS — W908XXA Exposure to other nonionizing radiation, initial encounter: Secondary | ICD-10-CM

## 2023-10-25 DIAGNOSIS — D1801 Hemangioma of skin and subcutaneous tissue: Secondary | ICD-10-CM

## 2023-10-25 DIAGNOSIS — D229 Melanocytic nevi, unspecified: Secondary | ICD-10-CM

## 2023-10-25 DIAGNOSIS — L570 Actinic keratosis: Secondary | ICD-10-CM | POA: Diagnosis not present

## 2023-10-25 DIAGNOSIS — Z85828 Personal history of other malignant neoplasm of skin: Secondary | ICD-10-CM | POA: Diagnosis not present

## 2023-10-25 NOTE — Patient Instructions (Addendum)
 For areas treated with Liquid Nitrogen:  Keep clean with soap and water.  Apply Vaseline or Aquaphor twice daily.   Important Information  Due to recent changes in healthcare laws, you may see results of your pathology and/or laboratory studies on MyChart before the doctors have had a chance to review them. We understand that in some cases there may be results that are confusing or concerning to you. Please understand that not all results are received at the same time and often the doctors may need to interpret multiple results in order to provide you with the best plan of care or course of treatment. Therefore, we ask that you please give us  2 business days to thoroughly review all your results before contacting the office for clarification. Should we see a critical lab result, you will be contacted sooner.   If You Need Anything After Your Visit  If you have any questions or concerns for your doctor, please call our main line at [phone] If no one answers, please leave a voicemail as directed and we will return your call as soon as possible. Messages left after 4 pm will be answered the following business day.   You may also send us  a message via MyChart. We typically respond to MyChart messages within 1-2 business days.  For prescription refills, please ask your pharmacy to contact our office. Our fax number is [phone].  If you have an urgent issue when the clinic is closed that cannot wait until the next business day, you can page your doctor at the number below.    Please note that while we do our best to be available for urgent issues outside of office hours, we are not available 24/7.   If you have an urgent issue and are unable to reach us , you may choose to seek medical care at your doctor's office, retail clinic, urgent care center, or emergency room.  If you have a medical emergency, please immediately call 911 or go to the emergency department. In the event of inclement  weather, please call our main line at [phone] for an update on the status of any delays or closures.  Dermatology Medication Tips: Please keep the boxes that topical medications come in in order to help keep track of the instructions about where and how to use these. Pharmacies typically print the medication instructions only on the boxes and not directly on the medication tubes.   If your medication is too expensive, please contact our office at [phone] or send us  a message through MyChart.   We are unable to tell what your co-pay for medications will be in advance as this is different depending on your insurance coverage. However, we may be able to find a substitute medication at lower cost or fill out paperwork to get insurance to cover a needed medication.   If a prior authorization is required to get your medication covered by your insurance company, please allow us  1-2 business days to complete this process.  Drug prices often vary depending on where the prescription is filled and some pharmacies may offer cheaper prices.  The website www.goodrx.com contains coupons for medications through different pharmacies. The prices here do not account for what the cost may be with help from insurance (it may be cheaper with your insurance), but the website can give you the price if you did not use any insurance.  - You can print the associated coupon and take it with your prescription to the pharmacy.  -  You may also stop by our office during regular business hours and pick up a GoodRx coupon card.  - If you need your prescription sent electronically to a different pharmacy, notify our office through [facility] or by phone at [phone]

## 2023-10-25 NOTE — Progress Notes (Signed)
 Total Body Skin Exam (TBSE) Visit   Subjective  Carl Lopez is a 73 y.o. male who presents for the following: Skin Cancer Screening and Full Body Skin Exam  Patient presents today for follow up visit for TBSE. Patient was last evaluated on 10/24/22 . Patient denies medication changes. Patient reports he  does not have spots, moles and lesions of concern to be evaluated. Patient reports throughout his lifetime he  has had moderate sun exposure. Currently, patient reports if he  has excessive sun exposure, he  does apply sunscreen and/or wears protective coverings. Patient reports he  has hx of bx. Patient denies  family history of skin cancers. The patient has spots, moles and lesions to be evaluated, some may be new or changing and the patient has concerns that these could be cancer.  The following portions of the chart were reviewed this encounter and updated as appropriate: medications, allergies, medical history  Review of Systems:  No other skin or systemic complaints except as noted in HPI or Assessment and Plan.  Objective  Well appearing patient in no apparent distress; mood and affect are within normal limits.  A full examination was performed including scalp, head, eyes, ears, nose, lips, neck, chest, axillae, abdomen, back, buttocks, bilateral upper extremities, bilateral lower extremities, hands, feet, fingers, toes, fingernails, and toenails. All findings within normal limits unless otherwise noted below.   Relevant physical exam findings are noted in the Assessment and Plan.    Assessment & Plan   LENTIGINES, SEBORRHEIC KERATOSES, HEMANGIOMAS - Benign normal skin lesions - Benign-appearing - Call for any changes  MELANOCYTIC NEVI - Tan-brown and/or pink-flesh-colored symmetric macules and papules - Benign appearing on exam today - Observation - Call clinic for new or changing moles - Recommend daily use of broad spectrum spf 30+ sunscreen to sun-exposed areas.    ACTINIC DAMAGE - Chronic condition, secondary to cumulative UV/sun exposure - diffuse scaly erythematous macules with underlying dyspigmentation - Recommend daily broad spectrum sunscreen SPF 30+ to sun-exposed areas, reapply every 2 hours as needed.  - Staying in the shade or wearing long sleeves, sun glasses (UVA+UVB protection) and wide brim hats (4-inch brim around the entire circumference of the hat) are also recommended for sun protection.  - Call for new or changing lesions.  HISTORY OF BASAL CELL CARCINOMA OF THE SKIN - No evidence of recurrence today - Recommend regular full body skin exams - Recommend daily broad spectrum sunscreen SPF 30+ to sun-exposed areas, reapply every 2 hours as needed.  - Call if any new or changing lesions are noted between office visits   ACTINIC KERATOSIS Exam: Erythematous thin papules/macules with gritty scale  Actinic keratoses are precancerous spots that appear secondary to cumulative UV radiation exposure/sun exposure over time. They are chronic with expected duration over 1 year. A portion of actinic keratoses will progress to squamous cell carcinoma of the skin. It is not possible to reliably predict which spots will progress to skin cancer and so treatment is recommended to prevent development of skin cancer.  Recommend daily broad spectrum sunscreen SPF 30+ to sun-exposed areas, reapply every 2 hours as needed.  Recommend staying in the shade or wearing long sleeves, sun glasses (UVA+UVB protection) and wide brim hats (4-inch brim around the entire circumference of the hat). Call for new or changing lesions.  Treatment Plan:  Prior to procedure, discussed risks of blister formation, small wound, skin dyspigmentation, or rare scar following cryotherapy. Recommend Vaseline ointment to treated  areas while healing.  Destruction Procedure Note Destruction method: cryotherapy   Informed consent: discussed and consent obtained   Lesion  destroyed using liquid nitrogen: Yes   Outcome: patient tolerated procedure well with no complications   Post-procedure details: wound care instructions given   Locations: scalp # of Lesions Treated: 1    SKIN CANCER SCREENING PERFORMED TODAY.     Return in about 1 year (around 10/24/2024) for TBSC.  Carl Lopez, Surg Tech III, am acting as scribe for [employer], DO.   Documentation: I have reviewed the above documentation for accuracy and completeness, and I agree with the above.  Carl Lenis, DO

## 2023-10-31 ENCOUNTER — Ambulatory Visit

## 2023-10-31 VITALS — Ht 71.0 in | Wt 175.0 lb

## 2023-10-31 DIAGNOSIS — Z0000 Encounter for general adult medical examination without abnormal findings: Secondary | ICD-10-CM

## 2023-10-31 NOTE — Patient Instructions (Signed)
 Mr. Klopf , Thank you for taking time out of your busy schedule to complete your Annual Wellness Visit with me. I enjoyed our conversation and look forward to speaking with you again next year. I, as well as your care team,  appreciate your ongoing commitment to your health goals. Please review the following plan we discussed and let me know if I can assist you in the future. Your Game plan/ To Do List    Referrals: If you haven't heard from the office you've been referred to, please reach out to them at the phone provided.   Follow up Visits: Next Medicare AWV with our clinical staff: 11/04/24   Have you seen your provider in the last 6 months (3 months if uncontrolled diabetes)? Yes Next Office Visit with your provider: will call to follow up for appt   Clinician Recommendations:  Aim for 30 minutes of exercise or brisk walking, 6-8 glasses of water, and 5 servings of fruits and vegetables each day.       This is a list of the screening recommended for you and due dates:  Health Maintenance  Topic Date Due   COVID-19 Vaccine (8 - Pfizer risk 2024-25 season) 08/18/2023   Hepatitis C Screening  06/05/2109*   Flu Shot  11/30/2023   Medicare Annual Wellness Visit  10/30/2024   Colon Cancer Screening  11/27/2025   DTaP/Tdap/Td vaccine (3 - Tdap) 08/14/2027   Pneumococcal Vaccine for age over 37  Completed   Zoster (Shingles) Vaccine  Completed   Hepatitis B Vaccine  Aged Out   HPV Vaccine  Aged Out   Meningitis B Vaccine  Aged Out  *Topic was postponed. The date shown is not the original due date.    Advanced directives: (Copy Requested) Please bring a copy of your health care power of attorney and living will to the office to be added to your chart at your convenience. You can mail to [facility] [address] [place], [place] [address] or email to [email or web address] Advance Care Planning is important because it:  [x]  Makes sure you receive the medical care  that is consistent with your values, goals, and preferences  [x]  It provides guidance to your family and loved ones and reduces their decisional burden about whether or not they are making the right decisions based on your wishes.  Follow the link provided in your after visit summary or read over the paperwork we have mailed to you to help you started getting your Advance Directives in place. If you need assistance in completing these, please reach out to us  so that we can help you!  See attachments for Preventive Care and Fall Prevention Tips.

## 2023-10-31 NOTE — Progress Notes (Addendum)
 Subjective:   Carl Lopez is a 73 y.o. who presents for a Medicare Wellness preventive visit.  As a reminder, Annual Wellness Visits don't include a physical exam, and some assessments may be limited, especially if this visit is performed virtually. We may recommend an in-person follow-up visit with your provider if needed.  Visit Complete: Virtual I connected with  Carl Lopez on 10/31/23 by a audio enabled telemedicine application and verified that I am speaking with the correct person using two identifiers.  Patient Location: Home  Provider Location: Home Office  I discussed the limitations of evaluation and management by telemedicine. The patient expressed understanding and agreed to proceed.  Vital Signs: Because this visit was a virtual/telehealth visit, some criteria may be missing or patient reported. Any vitals not documented were not able to be obtained and vitals that have been documented are patient reported.  VideoDeclined- This patient declined Librarian, academic. Therefore the visit was completed with audio only.  Persons Participating in Visit: Patient.  AWV Questionnaire: Yes: Patient Medicare AWV questionnaire was completed by the patient on 10/30/23; I have confirmed that all information answered by patient is correct and no changes since this date.  Cardiac Risk Factors include: advanced age (>29men, >74 women);male gender;dyslipidemia     Objective:    Today's Vitals   10/31/23 1257  Weight: 175 lb (79.4 kg)  Height: 5' 11 (1.803 m)   Body mass index is 24.41 kg/m.     10/31/2023    1:01 PM 10/19/2022    1:04 PM 10/13/2021    1:32 PM 08/08/2021    2:59 PM 08/26/2019   10:47 AM 11/28/2017    8:22 AM 11/22/2015    8:53 AM  Advanced Directives  Does Patient Have a Medical Advance Directive? Yes Yes Yes Yes No No  No   Type of Estate agent of [place];Living will Healthcare Power of [place];Living will  Healthcare Power of [employer] of [place];Living will;Out of facility DNR (pink MOST or yellow form)     Copy of Healthcare Power of Attorney in Chart? No - copy requested No - copy requested No - copy requested      Would patient like information on creating a medical advance directive?     Yes (MAU/Ambulatory/Procedural Areas - Information given) No - Patient declined       Data saved with a previous flowsheet row definition    Current Medications (verified) Outpatient Encounter Medications as of 10/31/2023  Medication Sig   aspirin EC 81 MG tablet Take 81 mg by mouth daily. Swallow whole.   atorvastatin  (LIPITOR) 40 MG tablet TAKE 1 TABLET BY MOUTH DAILY   cyanocobalamin  (VITAMIN B12) 1000 MCG/ML injection INJECT 1 ML INTO THE SKIN ONCE FOR 4 WEEKS THEN 1 ML EVERY MONTH   ferrous sulfate 325 (65 FE) MG tablet Take 325 mg by mouth once a week.   levothyroxine  (SYNTHROID ) 137 MCG tablet Take 1 tablet (137 mcg total) by mouth daily.   Multiple Vitamins-Minerals (PRESERVISION AREDS 2 PO) Take 1 tablet by mouth daily.   tadalafil  (CIALIS ) 20 MG tablet Take 1 tablet (20 mg total) by mouth daily as needed for erectile dysfunction.   No facility-administered encounter medications on file as of 10/31/2023.    Allergies (verified) Penicillins   History: Past Medical History:  Diagnosis Date   Allergy    BACK PAIN 01/26/2009   1987     BCC (basal cell carcinoma of  skin) 10/24/2022   left malar cheek MOHS on 11/29/22   Cataract    Chronic kidney disease    kidney stone 2017   DIVERTICULOSIS, COLON 07/29/2007   Hyperlipidemia    Thyroid  disease    73years old, meds 2-3 years   Past Surgical History:  Procedure Laterality Date   CHOLECYSTECTOMY  05/01/1986   COLONOSCOPY  2017   COLONOSCOPY W/ POLYPECTOMY  11/28/2022   [facility]dy at [facility] HERNIA REPAIR  05/01/1986   INGUINAL HERNIA REPAIR  05/31/2010   POLYPECTOMY     Family History  Problem Relation  Age of Onset   Colon cancer Maternal Uncle    Colon cancer Other    Breast cancer Mother        77   Dementia Father        50   CVA Father    Breast cancer Maternal Grandmother    Diabetes Maternal Grandfather    Kidney disease Paternal Grandfather    Esophageal cancer Neg Hx    Rectal cancer Neg Hx    Stomach cancer Neg Hx    Social History   Socioeconomic History   Marital status: Married    Spouse name: Not on file   Number of children: 0   Years of education: Not on file   Highest education level: Bachelor's degree (e.g., BA, AB, BS)  Occupational History   Not on file  Tobacco Use   Smoking status: Never    Passive exposure: Never   Smokeless tobacco: Never  Vaping Use   Vaping status: Never Used  Substance and Sexual Activity   Alcohol use: Yes    Alcohol/week: 5.0 standard drinks of alcohol    Types: 5 Glasses of wine per week    Comment: socially   Drug use: No   Sexual activity: Yes  Other Topics Concern   Not on file  Social History Narrative   Married 26 years in 2015. No kids. Retired from [employer], baggage handler/union repHobbies: Presenter, broadcasting, music, movies, reading      Right Handed    Lives in a one story home    Social Drivers of Health   Financial Resource Strain: Low Risk  (10/30/2023)   Overall Financial Resource Strain (CARDIA)    Difficulty of Paying Living Expenses: Not hard at all  Food Insecurity: No Food Insecurity (10/30/2023)   Hunger Vital Sign    Worried About Running Out of Food in the Last Year: Never true    Ran Out of Food in the Last Year: Never true  Transportation Needs: No Transportation Needs (10/30/2023)   PRAPARE - Administrator, Civil Service (Medical): No    Lack of Transportation (Non-Medical): No  Physical Activity: Sufficiently Active (10/30/2023)   Exercise Vital Sign    Days of Exercise per Week: 2 days    Minutes of Exercise per Session: 90 min  Stress: No Stress Concern Present (10/30/2023)   [employer] of Occupational Health - Occupational Stress Questionnaire    Feeling of Stress: Not at all  Social Connections: Socially Integrated (10/30/2023)   Social Connection and Isolation Panel    Frequency of Communication with Friends and Family: Three times a week    Frequency of Social Gatherings with Friends and Family: Once a week    Attends Religious Services: More than 4 times per year    Active Member of [employer] or Organizations: Yes    Attends Banker Meetings:  More than 4 times per year    Marital Status: Married    Tobacco Counseling Counseling given: Not Answered    Clinical Intake:  Pre-visit preparation completed: Yes  Pain : No/denies pain     BMI - recorded: 24.41 Nutritional Status: BMI of 19-24  Normal Diabetes: No  Lab Results  Component Value Date   HGBA1C 5.6 08/08/2023   HGBA1C 5.5 08/07/2022   HGBA1C 5.6 06/13/2021     How often do you need to have someone help you when you read instructions, pamphlets, or other written materials from your doctor or pharmacy?: 1 - Never  Interpreter Needed?: No  Information entered by :: Ellouise Haws, LPN   Activities of Daily Living     10/30/2023    3:57 PM  In your present state of health, do you have any difficulty performing the following activities:  Hearing? 0  Vision? 0  Difficulty concentrating or making decisions? 0  Walking or climbing stairs? 0  Dressing or bathing? 0  Doing errands, shopping? 0  Preparing Food and eating ? N  Using the Toilet? N  In the past six months, have you accidently leaked urine? N  Do you have problems with loss of bowel control? N  Managing your Medications? N  Managing your Finances? N  Housekeeping or managing your Housekeeping? N    Patient Care Team: Katrinka Garnette KIDD, MD as PCP - General (Family Medicine) Roz Anes, MD as Consulting Physician (Ophthalmology) Cam Morene ORN, MD as Consulting Physician (Urology) Patel, Donika K, DO  as Consulting Physician (Neurology)  I have updated your Care Teams any recent Medical Services you may have received from other providers in the past year.     Assessment:   This is a routine wellness examination for Richards.  Hearing/Vision screen No results found.   Goals Addressed             This Visit's Progress    Patient Stated       Increase water intake        Depression Screen     10/31/2023    1:05 PM 10/19/2022    1:03 PM 08/07/2022   10:11 AM 10/13/2021    1:31 PM 06/13/2021    2:15 PM 11/04/2019    4:01 PM 08/26/2019   10:47 AM  PHQ 2/9 Scores  PHQ - 2 Score 0 0 0 0 0 0 0  PHQ- 9 Score   0  1 2     Fall Risk     10/30/2023    3:57 PM 10/15/2022    1:03 PM 08/07/2022   10:11 AM 10/13/2021    1:33 PM 08/08/2021    2:59 PM  Fall Risk   Falls in the past year? 0 0 0 0 0  Number falls in past yr: 0  0 0 0  Injury with Fall? 0 0 0 0 0  Risk for fall due to : No Fall Risks Impaired vision No Fall Risks Impaired vision   Follow up Falls prevention discussed Falls prevention discussed Falls evaluation completed Falls prevention discussed       Data saved with a previous flowsheet row definition    MEDICARE RISK AT HOME:  Medicare Risk at Home Any stairs in or around the home?: (Patient-Rptd) Yes If so, are there any without handrails?: (Patient-Rptd) Yes Home free of loose throw rugs in walkways, pet beds, electrical cords, etc?: (Patient-Rptd) Yes Adequate lighting in your home to reduce risk  of falls?: (Patient-Rptd) Yes Life alert?: (Patient-Rptd) No Use of a cane, walker or w/c?: (Patient-Rptd) No Grab bars in the bathroom?: (Patient-Rptd) Yes Shower chair or bench in shower?: (Patient-Rptd) Yes Elevated toilet seat or a handicapped toilet?: (Patient-Rptd) No  TIMED UP AND GO:  Was the test performed?  No  Cognitive Function: 6CIT completed        10/31/2023    1:03 PM 10/19/2022    1:06 PM 10/13/2021    1:34 PM 08/26/2019   10:48 AM 11/28/2017     8:37 AM  6CIT Screen  What Year? 0 points 0 points 0 points 0 points 0 points  What month? 0 points 0 points 0 points 0 points 0 points  What time? 0 points 0 points 0 points 0 points 0 points  Count back from 20 0 points 0 points 0 points 0 points 0 points  Months in reverse 0 points 0 points 0 points 0 points 0 points  Repeat phrase 0 points 0 points 0 points 0 points   Total Score 0 points 0 points 0 points 0 points     Immunizations Immunization History  Administered Date(s) Administered   Fluad Quad(high Dose 65+) 02/05/2019, 02/02/2021, 02/13/2022   Fluad Trivalent(High Dose 65+) 02/14/2023   Influenza Split 02/27/2011, 02/09/2012   Influenza Whole 01/26/2009, 02/03/2010   Influenza, High Dose Seasonal PF 02/09/2016, 01/31/2017, 02/05/2018   Influenza,inj,Quad PF,6+ Mos 01/31/2013, 12/29/2013, 12/28/2014   Influenza-Unspecified 02/17/2020   Moderna Covid-19 Fall Seasonal Vaccine 30yrs & older 02/17/2023   PFIZER Comirnaty(Gray Top)Covid-19 Tri-Sucrose Vaccine 02/13/2022   PFIZER(Purple Top)SARS-COV-2 Vaccination 05/21/2019, 06/11/2019, 01/28/2020, 09/28/2020   Pfizer Covid-19 Vaccine Bivalent Booster 43yrs & up 03/01/2021   Pneumococcal Conjugate-13 06/30/2015   Pneumococcal Polysaccharide-23 12/29/2013   Td 06/29/2005, 08/13/2017   Zoster Recombinant(Shingrix) 07/02/2019, 10/14/2019   Zoster, Live 11/27/2011    Screening Tests Health Maintenance  Topic Date Due   COVID-19 Vaccine (8 - Pfizer risk 2024-25 season) 08/18/2023   Hepatitis C Screening  06/05/2109 (Originally 08/15/1968)   INFLUENZA VACCINE  11/30/2023   Medicare Annual Wellness (AWV)  10/30/2024   Colonoscopy  11/27/2025   DTaP/Tdap/Td (3 - Tdap) 08/14/2027   Pneumococcal Vaccine: 50+ Years  Completed   Zoster Vaccines- Shingrix  Completed   Hepatitis B Vaccines  Aged Out   HPV VACCINES  Aged Out   Meningococcal B Vaccine  Aged Out    Health Maintenance  Health Maintenance Due  Topic Date Due    COVID-19 Vaccine (8 - Pfizer risk 2024-25 season) 08/18/2023   Health Maintenance Items Addressed: See Nurse Notes at the end of this note  Additional Screening:  Vision Screening: Recommended annual ophthalmology exams for early detection of glaucoma and other disorders of the eye. Would you like a referral to an eye doctor? No    Dental Screening: Recommended annual dental exams for proper oral hygiene  Community Resource Referral / Chronic Care Management: CRR required this visit?  No   CCM required this visit?  No   Plan:    I have personally reviewed and noted the following in the patient's chart:   Medical and social history Use of alcohol, tobacco or illicit drugs  Current medications and supplements including opioid prescriptions. Patient is not currently taking opioid prescriptions. Functional ability and status Nutritional status Physical activity Advanced directives List of other physicians Hospitalizations, surgeries, and ER visits in previous 12 months Vitals Screenings to include cognitive, depression, and falls Referrals and appointments  In addition, I  have reviewed and discussed with patient certain preventive protocols, quality metrics, and best practice recommendations. A written personalized care plan for preventive services as well as general preventive health recommendations were provided to patient.   Ellouise VEAR Haws, LPN   06/07/7972   After Visit Summary: (MyChart) Due to this being a telephonic visit, the after visit summary with patients personalized plan was offered to patient via MyChart   Notes: Nothing significant to report at this time.

## 2024-01-29 DIAGNOSIS — H2513 Age-related nuclear cataract, bilateral: Secondary | ICD-10-CM | POA: Diagnosis not present

## 2024-01-31 ENCOUNTER — Ambulatory Visit: Payer: Self-pay | Admitting: *Deleted

## 2024-01-31 ENCOUNTER — Encounter: Payer: Self-pay | Admitting: Family Medicine

## 2024-01-31 NOTE — Telephone Encounter (Signed)
 FYI Only or Action Required?: FYI only for provider.  Patient was last seen in primary care on 08/08/2023 by Katrinka Garnette KIDD, MD.  Called Nurse Triage reporting Abdominal Pain.  Symptoms began several days ago.  Interventions attempted: Rest, hydration, or home remedies.  Symptoms are: unchanged.  Triage Disposition: See HCP Within 4 Hours (Or PCP Triage)  Patient/caregiver understands and will follow disposition?: No open appointment- scheduled first available in office- sent for PCP review  Reason for Disposition  [1] MILD-MODERATE pain AND [2] constant AND [3] age > 60 years  Answer Assessment - Initial Assessment Questions 1. LOCATION: Where does it hurt?      LLQ  3. ONSET: When did the pain begin? (Minutes, hours or days ago)      Tuesday 4. SUDDEN: Gradual or sudden onset?     Sudden- early evening Tuesday 5. PATTERN Does the pain come and go, or is it constant?     Dull ache, constant 6. SEVERITY: How bad is the pain?  (e.g., Scale 1-10; mild, moderate, or severe)     4/10 7. RECURRENT SYMPTOM: Have you ever had this type of stomach pain before? If Yes, ask: When was the last time? and What happened that time?      no 8. CAUSE: What do you think is causing the stomach pain? (e.g., gallstones, recent abdominal surgery)     Diverticulitis on colonoscopy- but has never had inflamation  10. OTHER SYMPTOMS: Do you have any other symptoms? (e.g., back pain, diarrhea, fever, urination pain, vomiting)       Vomiting with food  Protocols used: Abdominal Pain - Male-A-AH   Copied from CRM #[id number]. Topic: Clinical - Red Word Triage >> Jan 31, 2024  1:01 PM [place] A wrote: [facility] that prompted transfer to Nurse Triage: Patient is having pain from Diverticulosis since Tuesday

## 2024-01-31 NOTE — Telephone Encounter (Signed)
 Patient scheduled to see Sheppard Buttner 02/01/2024. Dr. Katrinka will review triage notes.

## 2024-02-01 ENCOUNTER — Encounter: Payer: Self-pay | Admitting: Physician Assistant

## 2024-02-01 ENCOUNTER — Ambulatory Visit: Payer: Self-pay | Admitting: Physician Assistant

## 2024-02-01 ENCOUNTER — Ambulatory Visit (INDEPENDENT_AMBULATORY_CARE_PROVIDER_SITE_OTHER): Admitting: Physician Assistant

## 2024-02-01 VITALS — BP 126/80 | HR 94 | Temp 97.1°F | Ht 71.0 in | Wt 178.2 lb

## 2024-02-01 DIAGNOSIS — Z87442 Personal history of urinary calculi: Secondary | ICD-10-CM | POA: Diagnosis not present

## 2024-02-01 DIAGNOSIS — R1032 Left lower quadrant pain: Secondary | ICD-10-CM

## 2024-02-01 DIAGNOSIS — Z23 Encounter for immunization: Secondary | ICD-10-CM | POA: Diagnosis not present

## 2024-02-01 DIAGNOSIS — K5790 Diverticulosis of intestine, part unspecified, without perforation or abscess without bleeding: Secondary | ICD-10-CM | POA: Diagnosis not present

## 2024-02-01 LAB — URINALYSIS, ROUTINE W REFLEX MICROSCOPIC
Leukocytes,Ua: NEGATIVE
Nitrite: NEGATIVE
Specific Gravity, Urine: 1.02 (ref 1.000–1.030)
Urine Glucose: NEGATIVE
Urobilinogen, UA: 4 — AB (ref 0.0–1.0)
pH: 6 (ref 5.0–8.0)

## 2024-02-01 LAB — CBC WITH DIFFERENTIAL/PLATELET
Basophils Absolute: 0 K/uL (ref 0.0–0.1)
Basophils Relative: 0.5 % (ref 0.0–3.0)
Eosinophils Absolute: 0.1 K/uL (ref 0.0–0.7)
Eosinophils Relative: 1 % (ref 0.0–5.0)
HCT: 47 % (ref 39.0–52.0)
Hemoglobin: 16 g/dL (ref 13.0–17.0)
Lymphocytes Relative: 8.7 % — ABNORMAL LOW (ref 12.0–46.0)
Lymphs Abs: 0.5 K/uL — ABNORMAL LOW (ref 0.7–4.0)
MCHC: 34 g/dL (ref 30.0–36.0)
MCV: 92.1 fl (ref 78.0–100.0)
Monocytes Absolute: 0.6 K/uL (ref 0.1–1.0)
Monocytes Relative: 11 % (ref 3.0–12.0)
Neutro Abs: 4.4 K/uL (ref 1.4–7.7)
Neutrophils Relative %: 78.8 % — ABNORMAL HIGH (ref 43.0–77.0)
Platelets: 220 K/uL (ref 150.0–400.0)
RBC: 5.1 Mil/uL (ref 4.22–5.81)
RDW: 13 % (ref 11.5–15.5)
WBC: 5.5 K/uL (ref 4.0–10.5)

## 2024-02-01 LAB — COMPREHENSIVE METABOLIC PANEL WITH GFR
ALT: 14 U/L (ref 0–53)
AST: 21 U/L (ref 0–37)
Albumin: 4.3 g/dL (ref 3.5–5.2)
Alkaline Phosphatase: 93 U/L (ref 39–117)
BUN: 15 mg/dL (ref 6–23)
CO2: 29 meq/L (ref 19–32)
Calcium: 9.3 mg/dL (ref 8.4–10.5)
Chloride: 102 meq/L (ref 96–112)
Creatinine, Ser: 1.16 mg/dL (ref 0.40–1.50)
GFR: 61.63 mL/min (ref >60.00)
Glucose, Bld: 108 mg/dL — ABNORMAL HIGH (ref 70–99)
Potassium: 3.7 meq/L (ref 3.5–5.1)
Sodium: 140 meq/L (ref 135–145)
Total Bilirubin: 1.4 mg/dL — ABNORMAL HIGH (ref 0.2–1.2)
Total Protein: 6.6 g/dL (ref 6.0–8.3)

## 2024-02-01 MED ORDER — TAMSULOSIN HCL 0.4 MG PO CAPS: 0.4000 mg | ORAL_CAPSULE | Freq: Every day | ORAL | 0 refills | Status: DC

## 2024-02-01 NOTE — Progress Notes (Signed)
 Carl Lopez is a 73 y.o. male here for a new problem.  History of Present Illness:   Chief Complaint  Patient presents with   Abdominal Pain    Pt c/o left lower quadrant abd pain radiating to back started Tuesday evening, took Tylenol  yesterday and pain has subsided, had pain one time with urination, denies blood in urine. Hx of Kidney stones.    Discussed the use of AI scribe software for clinical note transcription with the patient, who gave verbal consent to proceed.  History of Present Illness   Carl Lopez is a 73 year old male with diverticulosis and a history of kidney stones who presents with abdominal discomfort and vomiting.  Abdominal discomfort began on Tuesday evening after dinner, without acute pain, and persisted through the evening. By Wednesday morning, abdominal pressure was present, and vomiting occurred after consuming coffee and a cookie. Attempts to eat fruit also resulted in vomiting. He refrained from eating from Wednesday afternoon until Thursday evening, consuming only fluids like flat ginger ale. On Thursday, the pressure sensation returned in the afternoon but improved with Tylenol , allowing him to sleep. A brief episode of feeling hot and chilled occurred that evening, resolving after two hours. On Friday morning, he consumed coffee and a cookie without issue.  He sometimes experiences constipation, with no bowel movement from Monday to Friday, and uses Senokot as needed. His last bowel movement was on Monday, described as urgent and loose, without blood or black stool.  On Friday morning, a sharp, quick sensation at the base of his penis was noted, possibly related to passing a kidney stone, without blood in the urine. He has experienced similar sensations when passing stones after treatment with fluids and Flomax  in the ER.        Past Medical History:  Diagnosis Date   Allergy    BACK PAIN 01/26/2009   1987     BCC (basal cell carcinoma of  skin) 10/24/2022   left malar cheek MOHS on 11/29/22   Cataract    Chronic kidney disease    kidney stone 2017   DIVERTICULOSIS, COLON 07/29/2007   Hyperlipidemia    Thyroid  disease    73years old, meds 2-3 years     Social History   Tobacco Use   Smoking status: Never    Passive exposure: Never   Smokeless tobacco: Never  Vaping Use   Vaping status: Never Used  Substance Use Topics   Alcohol use: Yes    Alcohol/week: 5.0 standard drinks of alcohol    Types: 5 Glasses of wine per week    Comment: socially   Drug use: No    Past Surgical History:  Procedure Laterality Date   CHOLECYSTECTOMY  05/01/1986   COLONOSCOPY  2017   COLONOSCOPY W/ POLYPECTOMY  11/28/2022   Gwendlyn Buddy at [facility] HERNIA REPAIR  05/01/1986   INGUINAL HERNIA REPAIR  05/31/2010   POLYPECTOMY      Family History  Problem Relation Age of Onset   Colon cancer Maternal Uncle    Colon cancer Other    Breast cancer Mother        58   Dementia Father        88   CVA Father    Breast cancer Maternal Grandmother    Diabetes Maternal Grandfather    Kidney disease Paternal Grandfather    Esophageal cancer Neg Hx    Rectal cancer Neg Hx    Stomach  cancer Neg Hx     Allergies  Allergen Reactions   Penicillins Swelling and Other (See Comments)    Unknown Has patient had a PCN reaction causing immediate rash, facial/tongue/throat swelling, SOB or lightheadedness with hypotension:  unknown  Has patient had a PCN reaction causing severe rash involving mucus membranes or skin necrosis: unknown Has patient had a PCN reaction that required hospitalization : unknown,  Has patient had a PCN reaction occurring within the last 10 years: No Childhood allergy, swelling of arm after injection of penicillin    Current Medications:   Current Outpatient Medications:    aspirin EC 81 MG tablet, Take 81 mg by mouth daily. Swallow whole., Disp: , Rfl:    atorvastatin  (LIPITOR) 40 MG tablet, TAKE 1  TABLET BY MOUTH DAILY, Disp: 100 tablet, Rfl: 2   cyanocobalamin  (VITAMIN B12) 1000 MCG/ML injection, INJECT 1 ML INTO THE SKIN ONCE FOR 4 WEEKS THEN 1 ML EVERY MONTH, Disp: 6 mL, Rfl: 2   ferrous sulfate 325 (65 FE) MG tablet, Take 325 mg by mouth once a week., Disp: , Rfl:    levothyroxine  (SYNTHROID ) 137 MCG tablet, Take 1 tablet (137 mcg total) by mouth daily., Disp: 90 tablet, Rfl: 3   Multiple Vitamins-Minerals (PRESERVISION AREDS 2 PO), Take 1 tablet by mouth daily., Disp: , Rfl:    tadalafil  (CIALIS ) 20 MG tablet, Take 1 tablet (20 mg total) by mouth daily as needed for erectile dysfunction., Disp: 10 tablet, Rfl: 5   Review of Systems:   Negative unless otherwise specified per HPI.  Vitals:   Vitals:   02/01/24 1025  BP: 126/80  Pulse: 94  Temp: (!) 97.1 F (36.2 C)  TempSrc: Temporal  SpO2: 98%  Weight: 178 lb 4 oz (80.9 kg)  Height: 5' 11 (1.803 m)     Body mass index is 24.86 kg/m.  Physical Exam:   Physical Exam Vitals and nursing note reviewed.  Constitutional:      General: He is not in acute distress.    Appearance: He is well-developed. He is not ill-appearing or toxic-appearing.  Cardiovascular:     Rate and Rhythm: Normal rate and regular rhythm.     Pulses: Normal pulses.     Heart sounds: Normal heart sounds, S1 normal and S2 normal.  Pulmonary:     Effort: Pulmonary effort is normal.     Breath sounds: Normal breath sounds.  Abdominal:     General: Abdomen is flat. Bowel sounds are normal.     Palpations: Abdomen is soft.     Tenderness: There is no abdominal tenderness.  Skin:    General: Skin is warm and dry.  Neurological:     Mental Status: He is alert.     GCS: GCS eye subscore is 4. GCS verbal subscore is 5. GCS motor subscore is 6.  Psychiatric:        Speech: Speech normal.        Behavior: Behavior normal. Behavior is cooperative.     Assessment and Plan:   Assessment and Plan    Abdominal pain with nausea and  vomiting Intermittent abdominal discomfort with nausea and vomiting since Tuesday evening. Symptoms improved with bowel rest and acetaminophen . Differential includes diverticulitis, given history of diverticulosis, but no acute tenderness on examination. Symptoms have improved, suggesting possible resolution. - Order blood work to check white blood cell count and kidney function. - Advise soft and bland diet, avoiding difficult to digest foods. - Encourage hydration. - Consider  CT scan if symptoms worsen or blood work indicates high white blood cell count. - Advise to work on constipation management over the weekend. - Consider bowel regimen if symptoms persist.  Diverticulosis Current symptoms may suggest diverticulitis, but no confirmed past episodes. Symptoms have improved, reducing immediate concern for acute diverticulitis. - Monitor symptoms and consider CT scan if symptoms worsen.  History of nephrolithiasis History of kidney stones with recent sharp pain during urination, suggesting possible passage of a stone. No blood in urine reported. - Monitor for any recurrent symptoms or changes in urination.      Lucie Buttner, PA-C

## 2024-02-02 ENCOUNTER — Emergency Department (HOSPITAL_COMMUNITY)
Admission: EM | Admit: 2024-02-02 | Discharge: 2024-02-02 | Disposition: A | Discharge status: Home / Self Care | Attending: Emergency Medicine | Admitting: Emergency Medicine

## 2024-02-02 ENCOUNTER — Other Ambulatory Visit: Payer: Self-pay

## 2024-02-02 ENCOUNTER — Encounter (HOSPITAL_COMMUNITY): Payer: Self-pay | Admitting: Emergency Medicine

## 2024-02-02 ENCOUNTER — Emergency Department (HOSPITAL_COMMUNITY)

## 2024-02-02 DIAGNOSIS — K5730 Diverticulosis of large intestine without perforation or abscess without bleeding: Secondary | ICD-10-CM | POA: Diagnosis not present

## 2024-02-02 DIAGNOSIS — N23 Unspecified renal colic: Secondary | ICD-10-CM | POA: Diagnosis not present

## 2024-02-02 DIAGNOSIS — K8689 Other specified diseases of pancreas: Secondary | ICD-10-CM | POA: Diagnosis not present

## 2024-02-02 DIAGNOSIS — Z7982 Long term (current) use of aspirin: Secondary | ICD-10-CM | POA: Insufficient documentation

## 2024-02-02 DIAGNOSIS — N132 Hydronephrosis with renal and ureteral calculous obstruction: Secondary | ICD-10-CM | POA: Diagnosis not present

## 2024-02-02 DIAGNOSIS — R1032 Left lower quadrant pain: Secondary | ICD-10-CM | POA: Diagnosis present

## 2024-02-02 LAB — CBC WITH DIFFERENTIAL/PLATELET
Abs Immature Granulocytes: 0.04 K/uL (ref 0.00–0.07)
Basophils Absolute: 0 K/uL (ref 0.0–0.1)
Basophils Relative: 0 %
Eosinophils Absolute: 0.1 K/uL (ref 0.0–0.5)
Eosinophils Relative: 1 %
HCT: 44.6 % (ref 39.0–52.0)
Hemoglobin: 15.2 g/dL (ref 13.0–17.0)
Immature Granulocytes: 0 %
Lymphocytes Relative: 7 %
Lymphs Abs: 0.6 K/uL — ABNORMAL LOW (ref 0.7–4.0)
MCH: 31.1 pg (ref 26.0–34.0)
MCHC: 34.1 g/dL (ref 30.0–36.0)
MCV: 91.2 fL (ref 80.0–100.0)
Monocytes Absolute: 0.9 K/uL (ref 0.1–1.0)
Monocytes Relative: 10 %
Neutro Abs: 7.6 K/uL (ref 1.7–7.7)
Neutrophils Relative %: 82 %
Platelets: 209 K/uL (ref 150–400)
RBC: 4.89 MIL/uL (ref 4.22–5.81)
RDW: 12.2 % (ref 11.5–15.5)
WBC: 9.3 K/uL (ref 4.0–10.5)
nRBC: 0 % (ref 0.0–0.2)

## 2024-02-02 LAB — COMPREHENSIVE METABOLIC PANEL WITH GFR
ALT: 17 U/L (ref 0–44)
AST: 23 U/L (ref 15–41)
Albumin: 4.1 g/dL (ref 3.5–5.0)
Alkaline Phosphatase: 102 U/L (ref 38–126)
Anion gap: 13 (ref 5–15)
BUN: 16 mg/dL (ref 8–23)
CO2: 24 mmol/L (ref 22–32)
Calcium: 9.3 mg/dL (ref 8.9–10.3)
Chloride: 104 mmol/L (ref 98–111)
Creatinine, Ser: 1.26 mg/dL — ABNORMAL HIGH (ref 0.61–1.24)
GFR, Estimated: 59 mL/min — ABNORMAL LOW (ref >60)
Glucose, Bld: 121 mg/dL — ABNORMAL HIGH (ref 70–99)
Potassium: 3.4 mmol/L — ABNORMAL LOW (ref 3.5–5.1)
Sodium: 141 mmol/L (ref 135–145)
Total Bilirubin: 0.8 mg/dL (ref 0.0–1.2)
Total Protein: 6.4 g/dL — ABNORMAL LOW (ref 6.5–8.1)

## 2024-02-02 LAB — URINE CULTURE
MICRO NUMBER:: 17053940
Result:: NO GROWTH
SPECIMEN QUALITY:: ADEQUATE

## 2024-02-02 MED ORDER — OXYCODONE-ACETAMINOPHEN 5-325 MG PO TABS: 1.0000 | ORAL_TABLET | Freq: Four times a day (QID) | ORAL | 0 refills | Status: AC | PRN

## 2024-02-02 MED ORDER — KETOROLAC TROMETHAMINE 30 MG/ML IJ SOLN
15.0000 mg | Freq: Once | INTRAMUSCULAR | Status: AC
  Administered 2024-02-02: 15 mg via INTRAVENOUS
  Filled 2024-02-02: qty 1

## 2024-02-02 MED ORDER — ONDANSETRON 4 MG PO TBDP: ORAL_TABLET | ORAL | 0 refills | Status: AC

## 2024-02-02 MED ORDER — MORPHINE SULFATE (PF) 4 MG/ML IV SOLN
4.0000 mg | Freq: Once | INTRAVENOUS | Status: AC
  Administered 2024-02-02: 4 mg via INTRAVENOUS
  Filled 2024-02-02: qty 1

## 2024-02-02 MED ORDER — ONDANSETRON HCL 4 MG/2ML IJ SOLN
4.0000 mg | Freq: Once | INTRAMUSCULAR | Status: AC
  Administered 2024-02-02: 4 mg via INTRAVENOUS
  Filled 2024-02-02: qty 2

## 2024-02-02 MED ORDER — SODIUM CHLORIDE 0.9 % IV BOLUS
1000.0000 mL | Freq: Once | INTRAVENOUS | Status: AC
  Administered 2024-02-02: 1000 mL via INTRAVENOUS

## 2024-02-02 NOTE — ED Provider Notes (Signed)
 [name] AT [facility] Provider Note   CSN: [id number] Arrival date & time: 02/02/24  1609     Patient presents with: Flank Pain   Carl Lopez is a 73 y.o. male history of kidney stone, here presenting with left lower quadrant pain.  Patient has left lower quadrant pain for the last 4 days.  Patient had subjective fever 2 days ago.  Patient went to see primary care doctor yesterday and had unremarkable labs and UA that showed hematuria but no UTI.  Patient was told that he may need a CT scan if he has persistent pain to rule out diverticulitis.  However he states that today he has sharp onset of sudden pain and was doubled over in pain and vomited.  Patient has no persistent fever.  Patient states that his pain is much better now.  Patient took Tylenol  with minimal relief so came to the ER for further evaluation.  Patient states that he has remote history of kidney stone.   The history is provided by the patient.       Prior to Admission medications   Medication Sig Start Date End Date Taking? Authorizing Provider  aspirin EC 81 MG tablet Take 81 mg by mouth daily. Swallow whole.    [provider]  atorvastatin  (LIPITOR) 40 MG tablet TAKE 1 TABLET BY MOUTH DAILY 03/19/23   Katrinka Garnette KIDD, MD  cyanocobalamin  (VITAMIN B12) 1000 MCG/ML injection INJECT 1 ML INTO THE SKIN ONCE FOR 4 WEEKS THEN 1 ML EVERY MONTH 11/27/22   Katrinka Garnette KIDD, MD  ferrous sulfate 325 (65 FE) MG tablet Take 325 mg by mouth once a week.    [provider]  levothyroxine  (SYNTHROID ) 137 MCG tablet Take 1 tablet (137 mcg total) by mouth daily. 09/26/23   Katrinka Garnette KIDD, MD  Multiple Vitamins-Minerals (PRESERVISION AREDS 2 PO) Take 1 tablet by mouth daily.    [provider]  tadalafil  (CIALIS ) 20 MG tablet Take 1 tablet (20 mg total) by mouth daily as needed for erectile dysfunction. 08/07/22   Katrinka Garnette KIDD, MD  tamsulosin  (FLOMAX ) 0.4 MG CAPS  capsule Take 1 capsule (0.4 mg total) by mouth daily. 02/01/24   Job Lukes, PA    Allergies: Penicillins    Review of Systems  Gastrointestinal:  Positive for vomiting.  Genitourinary:  Positive for flank pain.  All other systems reviewed and are negative.   Updated Vital Signs BP 101/70 (BP Location: Right Arm)   Pulse (!) 102   Temp 98.4 F (36.9 C) (Oral)   Resp 16   SpO2 97%   Physical Exam Vitals and nursing note reviewed.  Constitutional:      Comments: Slightly uncomfortable  HENT:     Head: Normocephalic.     Nose: Nose normal.     Mouth/Throat:     Mouth: Mucous membranes are moist.  Eyes:     Extraocular Movements: Extraocular movements intact.     Pupils: Pupils are equal, round, and reactive to light.  Cardiovascular:     Rate and Rhythm: Normal rate and regular rhythm.     Pulses: Normal pulses.     Heart sounds: Normal heart sounds.  Pulmonary:     Effort: Pulmonary effort is normal.     Breath sounds: Normal breath sounds.  Abdominal:     General: Abdomen is flat.     Comments: Mild left lower quadrant tenderness.  No obvious CVA tenderness  Musculoskeletal:  General: Normal range of motion.     Cervical back: Normal range of motion and neck supple.  Skin:    General: Skin is warm.     Capillary Refill: Capillary refill takes less than 2 seconds.  Neurological:     General: No focal deficit present.     Mental Status: He is oriented to person, place, and time.  Psychiatric:        Mood and Affect: Mood normal.        Behavior: Behavior normal.     (all labs ordered are listed, but only abnormal results are displayed) Labs Reviewed  CBC WITH DIFFERENTIAL/PLATELET  COMPREHENSIVE METABOLIC PANEL WITH GFR  URINALYSIS, ROUTINE W REFLEX MICROSCOPIC    EKG: None  Radiology: No results found.   Procedures   Medications Ordered in the ED  sodium chloride  0.9 % bolus 1,000 mL (1,000 mLs Intravenous New Bag/Given 02/02/24 1647)   ondansetron  (ZOFRAN ) injection 4 mg (4 mg Intravenous Given 02/02/24 1647)  ketorolac (TORADOL) 30 MG/ML injection 15 mg (15 mg Intravenous Given 02/02/24 1648)  morphine (PF) 4 MG/ML injection 4 mg (4 mg Intravenous Given 02/02/24 1648)                                    Medical Decision Making Carl Lopez is a 73 y.o. male here presenting with left lower quadrant pain.  Concern for possible renal colic.  Plan to get CBC and CMP and UA and CT renal stone.  6:21 PM Reviewed patient's labs and they were unremarkable.  UA from yesterday did not show any UTI and urine culture is negative to date.  CT did showed obstructing 6 mm stone in the left ureter as well as a 11 mm stone in the right ureter with bilateral hydro.  His kidney function is normal and pain is under control.  At this point patient is stable for discharge and will refer to urology outpatient  Problems Addressed: Bilateral renal colic: acute illness or injury  Amount and/or Complexity of Data Reviewed Labs: ordered. Decision-making details documented in ED Course. Radiology: ordered.  Risk Prescription drug management.    Final diagnoses:  None    ED Discharge Orders     None          Patt Alm Macho, MD 02/02/24 [phone]

## 2024-02-02 NOTE — Discharge Instructions (Addendum)
 As we discussed, you have bilateral kidney stones  Please continue taking open Flomax  as prescribed by your doctor  Please take Tylenol  or Motrin for pain and Percocet for severe pain  You may take Zofran  as needed for nausea  Please call urology office for appointment in about a week  Return to ER if you have severe abdominal pain or vomiting or fever

## 2024-02-02 NOTE — ED Triage Notes (Signed)
 Pt reports left flank pain for several days. Was seen at [facility] however pain was improving at that time.  Pt states he was told if got worse he should come to the ED. Pt reports hx of kidney stones in 2019.

## 2024-02-04 ENCOUNTER — Telehealth: Payer: Self-pay | Admitting: Family Medicine

## 2024-02-04 NOTE — Telephone Encounter (Signed)
 You can place urgent referral as well under nephrolithiasis and have our team try to set him up-contact stat team

## 2024-02-04 NOTE — Telephone Encounter (Signed)
 Please see message below and advise.

## 2024-02-04 NOTE — Telephone Encounter (Signed)
 Was seen in [facility] on 02/02/24   Patient Name First: Carl Last: Lopez Gender: Male DOB: Apr 23, 1951 Age: 73 Y 5 M 18 D Return Phone Number: [phone] (Primary) Address: City/ State/ Zip: [place]  [address] Client [facility] - Human [facility] [employer] Provider Job Lukes- [place] Contact Type Call Who Is Calling Patient / Member / Family / Caregiver Call Type Triage / Clinical Relationship To Patient Self Return Phone Number [phone] (Primary) Chief Complaint SEVERE ABDOMINAL PAIN - Severe pain in abdomen Reason for Call Symptomatic / Request for Health Information Initial Comment The caller reports abd painn, symptoms are getting worse, and was told to call in and let them know he's going to the hospital Translation No Nurse Assessment Nurse: Georgina, RN, Arlyne Date/Time (Eastern Time): 02/02/2024 3:48:23 PM Confirm and document reason for call. If symptomatic, describe symptoms. ---Caller stated he is having severe abd pain and his PA thinks it may be a kidney stone. No fever. He stated the pain is constant. Does the patient have any new or worsening symptoms? ---Yes Will a triage be completed? ---Yes Related visit to physician within the last 2 weeks? ---No Does the PT have any chronic conditions? (i.e. diabetes, asthma, this includes High risk factors for pregnancy, etc.) ---No Is this a behavioral health or substance abuse call? ---No Guidelines Guideline Title Affirmed Question Affirmed Notes Nurse Date/Time Titus Time) Kidney Stone Follow-up Call [1] SEVERE pain (e.g., excruciating, scale 8-10) AND [2] NOT improved after pain medicine Georgina, RN, Jacqueline 02/02/2024 3:49:40 PM  Disp. Time Titus Time) Disposition Final User 02/02/2024 3:42:15 PM Send to Urgent Queue Back, Fairy 02/02/2024 3:51:02 PM Go to ED/UCC Now (or PCP triage) Yes Georgina, RN,  Arlyne Final Disposition 02/02/2024 3:51:02 PM Go to ED/UCC Now (or PCP triage) Chaney Georgina, RN, Arlyne Flint Disagree/Comply Comply Caller Understands Yes PreDisposition InappropriateToAsk Care Advice Given Per Guideline GO TO ED/UCC NOW (OR PCP TRIAGE): * IF NO PCP (PRIMARY CARE PROVIDER) SECOND-LEVEL TRIAGE: You need to be seen within the next hour. Go to the ED/UCC at _____________ Hospital. Leave as soon as you can. CAUTION: See Sources of Care below when considering where to send the patient. ANOTHER ADULT SHOULD DRIVE: * It is better and safer if another adult drives instead of you. Referrals [facility]

## 2024-02-04 NOTE — Telephone Encounter (Signed)
 Left vm for pt regarding follow up with urology.

## 2024-02-04 NOTE — Telephone Encounter (Signed)
 Please see patient message and advise.   Copied from CRM [id number]. Topic: General - Other >> Feb 04, 2024  4:14 PM Carl Lopez wrote: Reason for CRM: Patient returned call to CMA.Patient stated that he has reached out to urology but haven't been able to get in contact with them,he left a message for them. He stated that anything that Dr Katrinka could do to help him get schedule would be appreciated.

## 2024-02-04 NOTE — Telephone Encounter (Signed)
 He was seen and evaluated and encouraged outpatient neurology visit-has he been able to get scheduled yet?  If not please encourage him to schedule with urology

## 2024-02-06 ENCOUNTER — Other Ambulatory Visit: Payer: Self-pay | Admitting: Family Medicine

## 2024-02-06 DIAGNOSIS — N200 Calculus of kidney: Secondary | ICD-10-CM

## 2024-02-06 NOTE — Telephone Encounter (Signed)
 Urgent referral placed and sent in stat chat. Patient aware and verbalized understanding.

## 2024-02-07 ENCOUNTER — Telehealth: Payer: Self-pay

## 2024-02-07 NOTE — Telephone Encounter (Signed)
 Transition Care Management Follow-up Telephone Call Date of discharge and from where: 02/02/24 [facility] How have you been since you were released from the hospital? Better Any questions or concerns? No  Items Reviewed: Did the pt receive and understand the discharge instructions provided? Yes  Medications obtained and verified? Yes  Other? No  Any new allergies since your discharge? No  Dietary orders reviewed? No Do you have support at home? Yes   Home Care and Equipment/Supplies: Were home health services ordered? not applicable If so, what is the name of the agency?   Has the agency set up a time to come to the patient's home? not applicable Were any new equipment or medical supplies ordered?  No What is the name of the medical supply agency?  Were you able to get the supplies/equipment? not applicable Do you have any questions related to the use of the equipment or supplies? No  Functional Questionnaire: (I = Independent and D = Dependent) ADLs: I  Bathing/Dressing- I  Meal Prep- I  Eating- I  Maintaining continence- I  Transferring/Ambulation- I  Managing Meds- I  Follow up appointments reviewed:  PCP Hospital f/u appt confirmed? Appt not needed with PCP  Specialist Hospital f/u appt confirmed? Spoke w/ Urology and received urgent referral; pt calling to schedule  Are transportation arrangements needed? No  If their condition worsens, is the pt aware to call PCP or go to the Emergency Dept.? Yes Was the patient provided with contact information for the PCP's office or ED? Yes Was to pt encouraged to call back with questions or concerns? Yes

## 2024-02-24 ENCOUNTER — Other Ambulatory Visit: Payer: Self-pay | Admitting: Physician Assistant

## 2024-03-31 ENCOUNTER — Other Ambulatory Visit: Payer: Self-pay | Admitting: Family Medicine

## 2024-11-04 ENCOUNTER — Ambulatory Visit

## 2024-11-06 ENCOUNTER — Ambulatory Visit: Admitting: Dermatology
